# Patient Record
Sex: Male | Born: 1975 | Race: White | Hispanic: No | Smoking: Current every day smoker
Health system: Southern US, Community
[De-identification: ages and names within clinical notes are randomized; demographics above are authoritative.]

## PROBLEM LIST (undated history)

## (undated) DIAGNOSIS — F1910 Other psychoactive substance abuse, uncomplicated: Secondary | ICD-10-CM

## (undated) DIAGNOSIS — I10 Essential (primary) hypertension: Secondary | ICD-10-CM

## (undated) DIAGNOSIS — E119 Type 2 diabetes mellitus without complications: Secondary | ICD-10-CM

---

## 2012-09-15 ENCOUNTER — Emergency Department: Payer: Self-pay | Admitting: Emergency Medicine

## 2012-09-28 ENCOUNTER — Emergency Department: Payer: Self-pay | Admitting: Emergency Medicine

## 2013-09-27 ENCOUNTER — Emergency Department: Payer: Self-pay | Admitting: Emergency Medicine

## 2014-07-22 ENCOUNTER — Emergency Department
Admission: EM | Admit: 2014-07-22 | Discharge: 2014-07-22 | Disposition: A | Discharge status: Home / Self Care | Payer: Self-pay | Attending: Emergency Medicine | Admitting: Emergency Medicine

## 2014-07-22 ENCOUNTER — Encounter: Payer: Self-pay | Admitting: Emergency Medicine

## 2014-07-22 DIAGNOSIS — L02811 Cutaneous abscess of head [any part, except face]: Secondary | ICD-10-CM | POA: Insufficient documentation

## 2014-07-22 DIAGNOSIS — L0291 Cutaneous abscess, unspecified: Secondary | ICD-10-CM

## 2014-07-22 DIAGNOSIS — R61 Generalized hyperhidrosis: Secondary | ICD-10-CM | POA: Insufficient documentation

## 2014-07-22 MED ORDER — MUPIROCIN 2 % EX OINT
TOPICAL_OINTMENT | CUTANEOUS | Status: AC
Start: 2014-07-22 — End: 2015-07-22

## 2014-07-22 MED ORDER — TRAMADOL HCL 50 MG PO TABS: 50.0000 mg | ORAL_TABLET | Freq: Three times a day (TID) | ORAL | Status: DC | PRN

## 2014-07-22 MED ORDER — IBUPROFEN 800 MG PO TABS: 800.0000 mg | ORAL_TABLET | Freq: Three times a day (TID) | ORAL | Status: DC | PRN

## 2014-07-22 MED ORDER — SULFAMETHOXAZOLE-TRIMETHOPRIM 400-80 MG PO TABS: 1.0000 | ORAL_TABLET | Freq: Two times a day (BID) | ORAL | Status: DC

## 2014-07-22 NOTE — Discharge Instructions (Signed)
Keep area clean and dry. Clean daily with soap monitor. Apply topical anabiotic as prescribed. Take medications as prescribed.  Follow-up with primary care physician next week or the above as needed.  Return to the ER for new or worsening concerns.  Abscess An abscess is an infected area that contains a collection of pus and debris.It can occur in almost any part of the body. An abscess is also known as a furuncle or boil. CAUSES  An abscess occurs when tissue gets infected. This can occur from blockage of oil or sweat glands, infection of hair follicles, or a minor injury to the skin. As the body tries to fight the infection, pus collects in the area and creates pressure under the skin. This pressure causes pain. People with weakened immune systems have difficulty fighting infections and get certain abscesses more often.  SYMPTOMS Usually an abscess develops on the skin and becomes a painful mass that is red, warm, and tender. If the abscess forms under the skin, you may feel a moveable soft area under the skin. Some abscesses break open (rupture) on their own, but most will continue to get worse without care. The infection can spread deeper into the body and eventually into the bloodstream, causing you to feel ill.  DIAGNOSIS  Your caregiver will take your medical history and perform a physical exam. A sample of fluid may also be taken from the abscess to determine what is causing your infection. TREATMENT  Your caregiver may prescribe antibiotic medicines to fight the infection. However, taking antibiotics alone usually does not cure an abscess. Your caregiver may need to make a small cut (incision) in the abscess to drain the pus. In some cases, gauze is packed into the abscess to reduce pain and to continue draining the area. HOME CARE INSTRUCTIONS   Only take over-the-counter or prescription medicines for pain, discomfort, or fever as directed by your caregiver.  If you were prescribed  antibiotics, take them as directed. Finish them even if you start to feel better.  If gauze is used, follow your caregiver's directions for changing the gauze.  To avoid spreading the infection:  Keep your draining abscess covered with a bandage.  Wash your hands well.  Do not share personal care items, towels, or whirlpools with others.  Avoid skin contact with others.  Keep your skin and clothes clean around the abscess.  Keep all follow-up appointments as directed by your caregiver. SEEK MEDICAL CARE IF:   You have increased pain, swelling, redness, fluid drainage, or bleeding.  You have muscle aches, chills, or a general ill feeling.  You have a fever. MAKE SURE YOU:   Understand these instructions.  Will watch your condition.  Will get help right away if you are not doing well or get worse. Document Released: 12/05/2004 Document Revised: 08/27/2011 Document Reviewed: 05/10/2011 [facility] Patient Information 2015 [place] [place]. This information is not intended to replace advice given to you by your health care provider. Make sure you discuss any questions you have with your health care provider.

## 2014-07-22 NOTE — ED Provider Notes (Signed)
[facility] Emergency Department Provider Note  ____________________________________________  Time seen: Approximately 0940 AM  I have reviewed the triage vital signs and the nursing notes.   HISTORY  Chief Complaint Recurrent Skin Infections   HPI Marc Marks is a 39 y.o. male presents to the ER with complaints of area to the left side posterior head with drainage and tenderness. Patient states it originally started out as a small pimple however he then "picked" at it and then gradually increased with redness and drainage. Patient states spouse and mother have been actively draining pus from the area. Patient states it is no longer draining but still red and swollen. States pain is described as a tenderness at a 5 out of 10. Worsen palpated. Patient states he frequently gets pimples in this area and his hairline as he lays on that side and sweats at night. Patient denies fall or injury. Patient denies neck pain, headache, nausea, fever or other complaints.   History reviewed. No pertinent past medical history.  There are no active problems to display for this patient.   History reviewed. No pertinent past surgical history.  No current outpatient prescriptions on file.  Allergies Review of patient's allergies indicates no known allergies.  No family history on file.  Social History History  Substance Use Topics  . Smoking status: Never Smoker   . Smokeless tobacco: Not on file  . Alcohol Use: No    Review of Systems Constitutional: No fever/chills Eyes: No visual changes. ENT: No sore throat. Cardiovascular: Denies chest pain. Respiratory: Denies shortness of breath. Gastrointestinal: No abdominal pain.  No nausea, no vomiting.  No diarrhea.  No constipation. Genitourinary: Negative for dysuria. Musculoskeletal: Negative for back pain. Skin: positive as above Neurological: Negative for headaches, focal weakness or numbness.  10-point ROS  otherwise negative.  ____________________________________________   PHYSICAL EXAM:  VITAL SIGNS: ED Triage Vitals  Enc Vitals Group     BP 07/22/14 0841 147/94 mmHg     Pulse Rate 07/22/14 0841 96     Resp 07/22/14 0841 18     Temp 07/22/14 0841 97.7 F (36.5 C)     Temp Source 07/22/14 0841 Oral     SpO2 07/22/14 0841 99 %     Weight 07/22/14 0842 240 lb (108.863 kg)     Height 07/22/14 0842 5\' 11"  (1.803 m)     Head Cir --      Peak Flow --      Pain Score 07/22/14 0838 8     Pain Loc --      Pain Edu? --      Excl. in GC? --     Constitutional: Alert and oriented. Well appearing and in no acute distress. Eyes: Conjunctivae are normal. PERRL. EOMI. Head: Atraumatic. Nose: No congestion/rhinnorhea. Mouth/Throat: Mucous membranes are moist.  Oropharynx non-erythematous. Neck: No stridor.  No cervical spine tenderness to palpation. Hematological/Lymphatic/Immunilogical: No cervical lymphadenopathy. Cardiovascular: Normal rate, regular rhythm. Grossly normal heart sounds.  Good peripheral circulation. Respiratory: Normal respiratory effort.  No retractions. Lungs CTAB. Gastrointestinal: Soft and nontender. No distention. No abdominal bruits. No CVA tenderness. Musculoskeletal: No lower extremity tenderness nor edema.  No joint effusions. Neurologic:  Normal speech and language. No gross focal neurologic deficits are appreciated. Speech is normal. No gait instability. Skin:  Skin is warm, dry and intact. No rash noted. Left posterior head x 2 small <1 cm mildly erythematic and mildly indurated tender areas. No fluctuance, no pointing. No surrounding erythema.  No drainage.  Psychiatric: Mood and affect are normal. Speech and behavior are normal.  ____________________________________________  ____________________________________   PROCEDURES  Procedure(s) performed:  Left posterior head abscess drained by pt recently at home. Area cleaned with betadine. NO fluctuance. No  indication for I&D.  ____________________________________________   INITIAL IMPRESSION / ASSESSMENT AND PLAN / ED COURSE  Pertinent labs & imaging results that were available during my care of the patient were reviewed by me and considered in my medical decision making (see chart for details).  Well-appearing no acute distress. Patient presents to the ER for left posterior scalp abscess times approximately 1-2 weeks. Patient states that he and family have actively draining these areas. No current fluctuance or indication for I&D. Discussed wound care parameters keeping clean. We'll treat patient with by mouth Bactrim, when necessary tramadol, and topical mupirocin. Discussed follow-up and return parameters. ____________________________________________   FINAL CLINICAL IMPRESSION(S) / ED DIAGNOSES  Final diagnoses:  Abscess  left posterior scalp    Renford DillsLindsey Korena Nass, NP 07/22/14 1018  Minna AntisKevin Paduchowski, MD 07/24/14 1215

## 2014-07-22 NOTE — ED Notes (Signed)
Pt c/o red raised painful area to left side of head, states area also itches at times but mostly has burning sensation

## 2015-11-19 ENCOUNTER — Emergency Department
Admission: EM | Admit: 2015-11-19 | Discharge: 2015-11-20 | Disposition: A | Discharge status: Home / Self Care | Payer: Self-pay | Attending: Emergency Medicine | Admitting: Emergency Medicine

## 2015-11-19 ENCOUNTER — Encounter: Payer: Self-pay | Admitting: *Deleted

## 2015-11-19 DIAGNOSIS — L0291 Cutaneous abscess, unspecified: Secondary | ICD-10-CM

## 2015-11-19 DIAGNOSIS — L0231 Cutaneous abscess of buttock: Secondary | ICD-10-CM | POA: Insufficient documentation

## 2015-11-19 DIAGNOSIS — Z791 Long term (current) use of non-steroidal anti-inflammatories (NSAID): Secondary | ICD-10-CM | POA: Insufficient documentation

## 2015-11-19 MED ORDER — HYDROCODONE-ACETAMINOPHEN 5-325 MG PO TABS
1.0000 | ORAL_TABLET | Freq: Once | ORAL | Status: AC
  Administered 2015-11-19: 1 via ORAL
  Filled 2015-11-19: qty 1

## 2015-11-19 MED ORDER — HYDROCODONE-ACETAMINOPHEN 5-325 MG PO TABS: 1.0000 | ORAL_TABLET | ORAL | 0 refills | Status: DC | PRN

## 2015-11-19 MED ORDER — LIDOCAINE-EPINEPHRINE (PF) 1 %-1:200000 IJ SOLN
INTRAMUSCULAR | Status: AC
  Filled 2015-11-19: qty 30

## 2015-11-19 NOTE — ED Triage Notes (Signed)
Pt states he has an abscess on left buttock.  Sx for 1 week.  No drainage noted.

## 2015-11-19 NOTE — ED Provider Notes (Signed)
[facility] Provider Note   ____________________________________________    I have reviewed the triage vital signs and the nursing notes.   HISTORY  Chief Complaint Abscess     HPI Marc Marks is a 40 y.o. male who presents with complaints of an abscess on his buttocks. He reports that developed approximately one week ago and seemed to have gotten worse. He complains of significant pain while sitting which started yesterday. He reports his girlfriend attempted to pop it did not work. He denies fevers or chills. He has had abscesses in the past.   No past medical history on file.  There are no active problems to display for this patient.   No past surgical history on file.  Prior to Admission medications   Medication Sig Start Date End Date Taking? Authorizing Provider  HYDROcodone-acetaminophen (NORCO/VICODIN) 5-325 MG tablet Take 1 tablet by mouth every 4 (four) hours as needed for moderate pain. 11/19/15   Jene Everyobert Nuri Larmer, MD  ibuprofen (ADVIL,MOTRIN) 800 MG tablet Take 1 tablet (800 mg total) by mouth every 8 (eight) hours as needed for mild pain or moderate pain. 07/22/14   Renford DillsLindsey Miller, NP  sulfamethoxazole-trimethoprim (BACTRIM) 400-80 MG per tablet Take 1 tablet by mouth 2 (two) times daily. 07/22/14   Renford DillsLindsey Miller, NP  traMADol (ULTRAM) 50 MG tablet Take 1 tablet (50 mg total) by mouth every 8 (eight) hours as needed (Do not drive or operate machinery while taking as can cause drowsiness.). 07/22/14   Renford DillsLindsey Miller, NP     Allergies Tramadol  No family history on file.  Social History Social History  Substance Use Topics  . Smoking status: Never Smoker  . Smokeless tobacco: Never Used  . Alcohol use No    Review of Systems  Constitutional: No fever/chills     Gastrointestinal: No abdominal pain.  No nausea, no vomiting.    Skin: Skin abscess as  above    ____________________________________________   PHYSICAL EXAM:  VITAL SIGNS: ED Triage Vitals  Enc Vitals Group     BP 11/19/15 2220 (!) 150/93     Pulse Rate 11/19/15 2219 96     Resp 11/19/15 2219 20     Temp 11/19/15 2219 98.5 F (36.9 C)     Temp Source 11/19/15 2219 Oral     SpO2 11/19/15 2219 98 %     Weight 11/19/15 2220 220 lb (99.8 kg)     Height 11/19/15 2220 5\' 10"  (1.778 m)     Head Circumference --      Peak Flow --      Pain Score 11/19/15 2220 8     Pain Loc --      Pain Edu? --      Excl. in GC? --      Constitutional: Alert and oriented. No acute distress. Pleasant and interactive Eyes: Conjunctivae are normal.  Head: Atraumatic. Nose: No congestion/rhinnorhea. Mouth/Throat: Mucous membranes are moist.   Cardiovascular: Normal rate, regular rhythm.  Respiratory: Normal respiratory effort.  No retractions. Genitourinary: deferred Musculoskeletal: No lower extremity tenderness nor edema.   Neurologic:  Normal speech and language. No gross focal neurologic deficits are appreciated.   Skin:  Skin is warm, dry and intact. Patient with area of fluctuance and erythema actually 2 x 3 cm on the left buttock which has come to head. No surrounding erythema.   ____________________________________________   LABS (all labs ordered are listed, but only abnormal results are displayed)  Labs  Reviewed - No data to display ____________________________________________  EKG   ____________________________________________  RADIOLOGY   ____________________________________________   PROCEDURES  Procedure(s) performed: yes  INCISION AND DRAINAGE Performed by: Jene Every Consent: Verbal consent obtained. Risks and benefits: risks, benefits and alternatives were discussed Type: abscess  Body area: Left buttock  Anesthesia: local infiltration  Incision was made with a scalpel.  Local anesthetic: lidocaine 1% w epinephrine  Anesthetic  total: 2 ml  Complexity: complex Blunt dissection to break up loculations  Drainage: purulent  Drainage amount: moderate  Packing material: 1/4 in iodoform gauze  Patient tolerance: Patient tolerated the procedure well with no immediate complications.       Critical Care performed: No ____________________________________________   INITIAL IMPRESSION / ASSESSMENT AND PLAN / ED COURSE  Pertinent labs & imaging results that were available during my care of the patient were reviewed by me and considered in my medical decision making (see chart for details).  Successful I&D of abscess. No enema Indicated. Pain medication prescribed. Follow-up in 2 days as needed   ____________________________________________   FINAL CLINICAL IMPRESSION(S) / ED DIAGNOSES  Final diagnoses:  Abscess      NEW MEDICATIONS STARTED DURING THIS VISIT:  New Prescriptions   HYDROCODONE-ACETAMINOPHEN (NORCO/VICODIN) 5-325 MG TABLET    Take 1 tablet by mouth every 4 (four) hours as needed for moderate pain.     Note:  This document was prepared using Dragon voice recognition software and may include unintentional dictation errors.    Jene Every, MD 11/19/15 [phone]

## 2017-03-22 ENCOUNTER — Emergency Department: Payer: Self-pay

## 2017-03-22 ENCOUNTER — Emergency Department
Admission: EM | Admit: 2017-03-22 | Discharge: 2017-03-22 | Disposition: A | Discharge status: Home / Self Care | Payer: Self-pay | Attending: Emergency Medicine | Admitting: Emergency Medicine

## 2017-03-22 ENCOUNTER — Encounter: Payer: Self-pay | Admitting: Emergency Medicine

## 2017-03-22 ENCOUNTER — Other Ambulatory Visit: Payer: Self-pay

## 2017-03-22 DIAGNOSIS — F4320 Adjustment disorder, unspecified: Secondary | ICD-10-CM

## 2017-03-22 DIAGNOSIS — F1990 Other psychoactive substance use, unspecified, uncomplicated: Secondary | ICD-10-CM

## 2017-03-22 DIAGNOSIS — F19188 Other psychoactive substance abuse with other psychoactive substance-induced disorder: Secondary | ICD-10-CM | POA: Insufficient documentation

## 2017-03-22 DIAGNOSIS — Z79899 Other long term (current) drug therapy: Secondary | ICD-10-CM | POA: Insufficient documentation

## 2017-03-22 DIAGNOSIS — R51 Headache: Secondary | ICD-10-CM | POA: Insufficient documentation

## 2017-03-22 DIAGNOSIS — F1994 Other psychoactive substance use, unspecified with psychoactive substance-induced mood disorder: Secondary | ICD-10-CM

## 2017-03-22 HISTORY — DX: Essential (primary) hypertension: I10

## 2017-03-22 HISTORY — DX: Type 2 diabetes mellitus without complications: E11.9

## 2017-03-22 LAB — COMPREHENSIVE METABOLIC PANEL
ALT: 19 U/L (ref 17–63)
AST: 29 U/L (ref 15–41)
Albumin: 4.4 g/dL (ref 3.5–5.0)
Alkaline Phosphatase: 71 U/L (ref 38–126)
Anion gap: 13 (ref 5–15)
BILIRUBIN TOTAL: 1.1 mg/dL (ref 0.3–1.2)
BUN: 14 mg/dL (ref 6–20)
CO2: 24 mmol/L (ref 22–32)
Calcium: 8.9 mg/dL (ref 8.9–10.3)
Chloride: 105 mmol/L (ref 101–111)
Creatinine, Ser: 1.11 mg/dL (ref 0.61–1.24)
GFR calc non Af Amer: >60 mL/min (ref >60)
Glucose, Bld: 261 mg/dL — ABNORMAL HIGH (ref 65–99)
Potassium: 3.6 mmol/L (ref 3.5–5.1)
Sodium: 142 mmol/L (ref 135–145)
Total Protein: 8 g/dL (ref 6.5–8.1)

## 2017-03-22 LAB — URINE DRUG SCREEN, QUALITATIVE (ARMC ONLY)
AMPHETAMINES, UR SCREEN: NOT DETECTED
BENZODIAZEPINE, UR SCRN: NOT DETECTED
Barbiturates, Ur Screen: NOT DETECTED
Cannabinoid 50 Ng, Ur ~~LOC~~: NOT DETECTED
Cocaine Metabolite,Ur ~~LOC~~: NOT DETECTED
MDMA (Ecstasy)Ur Screen: NOT DETECTED
Methadone Scn, Ur: NOT DETECTED
Opiate, Ur Screen: POSITIVE — AB
PHENCYCLIDINE (PCP) UR S: NOT DETECTED
Tricyclic, Ur Screen: NOT DETECTED

## 2017-03-22 LAB — CBC WITH DIFFERENTIAL/PLATELET
Basophils Absolute: 0.1 10*3/uL (ref 0–0.1)
Basophils Relative: 1 %
Eosinophils Absolute: 0.1 10*3/uL (ref 0–0.7)
Eosinophils Relative: 1 %
HEMATOCRIT: 42.1 % (ref 40.0–52.0)
Hemoglobin: 14.1 g/dL (ref 13.0–18.0)
LYMPHS PCT: 10 %
Lymphs Abs: 1.3 10*3/uL (ref 1.0–3.6)
MCH: 28.5 pg (ref 26.0–34.0)
MCHC: 33.6 g/dL (ref 32.0–36.0)
MCV: 85 fL (ref 80.0–100.0)
MONOS PCT: 11 %
Monocytes Absolute: 1.4 10*3/uL — ABNORMAL HIGH (ref 0.2–1.0)
NEUTROS PCT: 77 %
Neutro Abs: 10.3 10*3/uL — ABNORMAL HIGH (ref 1.4–6.5)
Platelets: 246 10*3/uL (ref 150–440)
RBC: 4.96 MIL/uL (ref 4.40–5.90)
RDW: 14.7 % — ABNORMAL HIGH (ref 11.5–14.5)
WBC: 13.2 10*3/uL — AB (ref 3.8–10.6)

## 2017-03-22 LAB — ETHANOL: Alcohol, Ethyl (B): <10 mg/dL (ref <10)

## 2017-03-22 LAB — ACETAMINOPHEN LEVEL

## 2017-03-22 LAB — SALICYLATE LEVEL: Salicylate Lvl: <7 mg/dL (ref 2.8–30.0)

## 2017-03-22 MED ORDER — LORAZEPAM 2 MG/ML IJ SOLN
2.0000 mg | Freq: Once | INTRAMUSCULAR | Status: AC
  Administered 2017-03-22: 2 mg via INTRAMUSCULAR

## 2017-03-22 MED ORDER — DIPHENHYDRAMINE HCL 50 MG/ML IJ SOLN
25.0000 mg | Freq: Once | INTRAMUSCULAR | Status: AC
  Administered 2017-03-22: 25 mg via INTRAMUSCULAR

## 2017-03-22 MED ORDER — LORAZEPAM 2 MG/ML IJ SOLN
INTRAMUSCULAR | Status: AC
  Administered 2017-03-22: 2 mg via INTRAMUSCULAR
  Filled 2017-03-22: qty 1

## 2017-03-22 MED ORDER — HALOPERIDOL LACTATE 5 MG/ML IJ SOLN
5.0000 mg | Freq: Once | INTRAMUSCULAR | Status: AC
  Administered 2017-03-22: 5 mg via INTRAMUSCULAR

## 2017-03-22 MED ORDER — DIPHENHYDRAMINE HCL 50 MG/ML IJ SOLN
INTRAMUSCULAR | Status: AC
  Administered 2017-03-22: 25 mg via INTRAMUSCULAR
  Filled 2017-03-22: qty 1

## 2017-03-22 MED ORDER — SODIUM CHLORIDE 0.9 % IV BOLUS (SEPSIS)
1000.0000 mL | Freq: Once | INTRAVENOUS | Status: AC
  Administered 2017-03-22: 1000 mL via INTRAVENOUS

## 2017-03-22 MED ORDER — HALOPERIDOL LACTATE 5 MG/ML IJ SOLN
INTRAMUSCULAR | Status: AC
  Administered 2017-03-22: 5 mg via INTRAMUSCULAR
  Filled 2017-03-22: qty 1

## 2017-03-22 NOTE — ED Notes (Signed)
Patient cont drowsy. Not taking po fluids. IV and NS started.

## 2017-03-22 NOTE — ED Provider Notes (Signed)
-----------------------------------------   3:17 PM on 03/22/2017 ----------------------------------------- Psychiatry consult note reviewed, finds the patient to be psychiatrically stable and not a danger to himself or others. Diagnosis of adjustment disorder. UDS is positive for opiate, and by history the patient has some substantial drug use necessitating Narcan on scene. Consistent with opiate use disorder as well. Patient's been given resources for outpatient substance abuse management. Remains calm comfortable with stable vital signs here in the ED currently. Medically stable. He does not have a guardian. IVC reversed by the psychiatrist.  Final diagnoses:  Substance induced mood disorder (HCC)  Adjustment disorder, unspecified type  Substance use disorder        Sharman CheekStafford, Ashlon Lottman, MD 03/22/17 [phone]

## 2017-03-22 NOTE — BH Assessment (Signed)
Assessment Note  Marc Marks is an 42 y.o. male admitted through IVC. Pt denies any SI, HI, AH, or VH at time of assessment. Pt reports that he was admitted following dispute with mother at home where he became irate and admitted to "hitting stuff". When asked why the police were called pt reports, "I reckon because of my behaviors." Pt reports that after the argument with mom that he went to sleep and woke up with 3 or 4 cops in his room who "bullied him into coming up here." Pt is suspected of being a substance user, but denies any use besides chewing tobacco. Pt has previously denied urine sample.  Diagnosis: Polysubstance use  Past Medical History:  Past Medical History:  Diagnosis Date  . Diabetes mellitus without complication (HCC)   . Hypertension     History reviewed. No pertinent surgical history.  Family History: History reviewed. No pertinent family history.  Social History:  reports that  has never smoked. His smokeless tobacco use includes snuff. He reports that he uses drugs. He reports that he does not drink alcohol.  Additional Social History:  Alcohol / Drug Use Pain Medications: see mar Prescriptions: see mar Over the Counter: see mar History of alcohol / drug use?: Yes Longest period of sobriety (when/how long): Unknown, pt denies alcohol use Negative Consequences of Use: Financial, Personal relationships Withdrawal Symptoms: Agitation, Sweats Substance #1 Name of Substance 1: Chewing Tobacco 1 - Age of First Use: unknown 1 - Amount (size/oz): pt did not specify amount 1 - Frequency: daily 1 - Duration: unknown 1 - Last Use / Amount: 03/21/2017  CIWA: CIWA-Ar BP: 118/85 Pulse Rate: 70 COWS:    Allergies:  Allergies  Allergen Reactions  . Tramadol Other (See Comments)    dizziness    Home Medications:  (Not in a hospital admission)  OB/GYN Status:  No LMP for male patient.  General Assessment Data Assessment unable to be completed:  (Completed) Location of Assessment: [facility] ED TTS Assessment: In system Is this a Tele or Face-to-Face Assessment?: Face-to-Face Is this an Initial Assessment or a Re-assessment for this encounter?: Initial Assessment Marital status: Divorced [place] name: N/A Is patient pregnant?: No Pregnancy Status: No Living Arrangements: Parent(Pt reports to living with mother) Can pt return to current living arrangement?: Yes Admission Status: Involuntary Is patient capable of signing voluntary admission?: No Referral Source: Self/Family/Friend Insurance type: None reported  Medical Screening Exam [facility] Walk-in ONLY) Medical Exam completed: Yes  Crisis Care Plan Living Arrangements: Parent(Pt reports to living with mother) Legal Guardian: Other:(N/A) Name of Psychiatrist: None indicated Name of Therapist: None indicated  Education Status Is patient currently in school?: No Current Grade: N/A Highest grade of school patient has completed: 12th Name of school: Unknown Contact person: Unknown  Risk to self with the past 6 months Suicidal Ideation: No-Not Currently/Within Last 6 Months Has patient been a risk to self within the past 6 months prior to admission? : Other (comment)(Pt denies) Suicidal Intent: No-Not Currently/Within Last 6 Months Has patient had any suicidal intent within the past 6 months prior to admission? : (Pt denies) Is patient at risk for suicide?: No, but patient needs Medical Clearance Suicidal Plan?: No Has patient had any suicidal plan within the past 6 months prior to admission? : Other (comment)(Pt denies) Access to Means: No What has been your use of drugs/alcohol within the last 12 months?: Pt denies any use besides tobacco Previous Attempts/Gestures: No How many times?: (N/A) Other Self Harm  Risks: None indicated Triggers for Past Attempts: None known Intentional Self Injurious Behavior: None Family Suicide History: No Recent stressful life event(s):  Conflict (Comment)(Pt reports to being triggered by argument with mother) Persecutory voices/beliefs?: No Depression: Yes Depression Symptoms: Feeling angry/irritable, Feeling worthless/self pity Substance abuse history and/or treatment for substance abuse?: (Unknown) Suicide prevention information given to non-admitted patients: Not applicable  Risk to Others within the past 6 months Homicidal Ideation: No Does patient have any lifetime risk of violence toward others beyond the six months prior to admission? : Unknown Thoughts of Harm to Others: No Current Homicidal Intent: No Current Homicidal Plan: No Access to Homicidal Means: No Identified Victim: (N/A) History of harm to others?: No Assessment of Violence: On admission Violent Behavior Description: Pt. admits to "hitting stuff" prior to IVC Does patient have access to weapons?: No Criminal Charges Pending?: Yes Describe Pending Criminal Charges: Traffic/ Infraction Does patient have a court date: Yes Court Date: 05/15/17 Is patient on probation?: Unknown  Psychosis Hallucinations: None noted Delusions: None noted  Mental Status Report Appearance/Hygiene: Disheveled Eye Contact: Poor Motor Activity: Restlessness, Shuffling, Agitation Speech: Incoherent, Soft, Slurred Level of Consciousness: Drowsy, Restless Mood: Apathetic, Irritable Affect: Irritable, Depressed, Blunted Anxiety Level: Minimal Thought Processes: Circumstantial Judgement: Partial Orientation: Person, Place, Time, Situation, Appropriate for developmental age Obsessive Compulsive Thoughts/Behaviors: None  Cognitive Functioning Concentration: Poor Memory: Recent Intact IQ: Average Insight: Fair Impulse Control: Fair Appetite: Good Weight Loss: 0 Weight Gain: 0 Sleep: Increased Total Hours of Sleep: 8 Vegetative Symptoms: None  ADLScreening [facility] Assessment Services) Patient's cognitive ability adequate to safely complete daily activities?:  Yes Patient able to express need for assistance with ADLs?: Yes Independently performs ADLs?: Yes (appropriate for developmental age)  Prior Inpatient Therapy Prior Inpatient Therapy: No Prior Therapy Dates: N/A Prior Therapy Facilty/Provider(s): N/A Reason for Treatment: N/A  Prior Outpatient Therapy Prior Outpatient Therapy: No Prior Therapy Dates: N/A Prior Therapy Facilty/Provider(s): (N/A) Reason for Treatment: (N/A) Does patient have an ACCT team?: No Does patient have Intensive In-House Services?  : No Does patient have P4CC services?: No  ADL Screening (condition at time of admission) Patient's cognitive ability adequate to safely complete daily activities?: Yes Is the patient deaf or have difficulty hearing?: No Does the patient have difficulty seeing, even when wearing glasses/contacts?: No Does the patient have difficulty concentrating, remembering, or making decisions?: No Patient able to express need for assistance with ADLs?: Yes Does the patient have difficulty dressing or bathing?: No Independently performs ADLs?: Yes (appropriate for developmental age) Does the patient have difficulty walking or climbing stairs?: No Weakness of Legs: None Weakness of Arms/Hands: None  Home Assistive Devices/Equipment Home Assistive Devices/Equipment: None  Therapy Consults (therapy consults require a physician order) PT Evaluation Needed: No OT Evalulation Needed: No SLP Evaluation Needed: No Abuse/Neglect Assessment (Assessment to be complete while patient is alone) Abuse/Neglect Assessment Can Be Completed: Yes Physical Abuse: Denies Verbal Abuse: Denies Sexual Abuse: Denies Exploitation of patient/patient's resources: Denies Self-Neglect: Denies Values / Beliefs Cultural Requests During Hospitalization: None Spiritual Requests During Hospitalization: None Consults Spiritual Care Consult Needed: No Social Work Consult Needed: No Merchant navy officer (For  Healthcare) Does Patient Have a Medical Advance Directive?: No    Additional Information CIRT Risk: No Does patient have medical clearance?: No(Waiting for urine sample)     Disposition:  Disposition Initial Assessment Completed for this Encounter: Yes Disposition of Patient: Pending Review with psychiatrist  On Site Evaluation by:   Reviewed with Physician:  Aubery LappingJerrica  Elliannah Wayment, MS, LPCA 03/22/2017 12:18 PM

## 2017-03-22 NOTE — ED Notes (Signed)
Mother, Marc Marks, [phone], reports pt was acting erratic and that is why she called 911, reports pt has bad dreams, is suspicious that pt may die in the same manner that pt's fiancee died on Nov 26 (unintentional overdose suspected), mother unable to verbalize specific suspicions for further illegal drug use, wants pt to to "straighten up his life"  Pt reports drinking at friends house (who also lost a spouse) beers and the ingestion of 5mg  percocet, pt resents mother's suspicions, pt denies SI/HI/hallucinations, requested to go home - pt calm and but perturbed at being held against will  EMS reports unable to determine rational sequence of events: sheriff dept reports pt "bouncing off the walls", medics on scene report pt so obtunded they gave 2 x internasal Narcan
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
## 2017-03-22 NOTE — ED Notes (Signed)
Pt given a cup of water and a pillow. Pt lying in bed asleep calm. Nothing needed from staff at this time

## 2017-03-22 NOTE — ED Notes (Signed)
Awake. Wants to go home. Requested provide urine spec to complete his workup. Secretary [facility]

## 2017-03-22 NOTE — ED Triage Notes (Signed)
Patient presents to Emergency Department via EMS with complaints of HA

## 2017-03-22 NOTE — ED Notes (Signed)
Patient awakened briefly. Alert and knows is in hospital when questioned. Denies discomfort except fatigue. Noted jumpy and wide eyed when spoken to. Returns to sleep when undisturbed.

## 2017-03-22 NOTE — ED Notes (Signed)
BEHAVIORAL HEALTH ROUNDING Patient sleeping: Yes Patient alert and oriented: not applicable Behavior appropriate: Yes.  ; If no, describe:  Nutrition and fluids offered: Yes  Toileting and hygiene offered: No Sitter present: not applicable Law enforcement present: Yes  

## 2017-03-22 NOTE — ED Notes (Signed)
BEHAVIORAL HEALTH ROUNDING Patient sleeping: Yes.   Patient alert and oriented: yes Behavior appropriate: Yes.  ; If no, describe:  Nutrition and fluids offered: Yes  Toileting and hygiene offered: Yes  Sitter present: not applicable Law enforcement present: Yes  

## 2017-03-22 NOTE — ED Notes (Signed)
[facility] [phone]

## 2017-03-22 NOTE — ED Notes (Signed)
Patient alert and oriented. Gait steady. IVC released. Pt discharged with instructions. To lobby to meet mom for transport home.

## 2017-03-22 NOTE — ED Notes (Signed)

## 2017-12-22 ENCOUNTER — Emergency Department: Payer: No Typology Code available for payment source

## 2017-12-22 ENCOUNTER — Other Ambulatory Visit: Payer: Self-pay

## 2017-12-22 ENCOUNTER — Emergency Department
Admission: EM | Admit: 2017-12-22 | Discharge: 2017-12-23 | Disposition: A | Discharge status: Home / Self Care | Payer: No Typology Code available for payment source | Attending: Emergency Medicine | Admitting: Emergency Medicine

## 2017-12-22 DIAGNOSIS — E119 Type 2 diabetes mellitus without complications: Secondary | ICD-10-CM | POA: Insufficient documentation

## 2017-12-22 DIAGNOSIS — M79671 Pain in right foot: Secondary | ICD-10-CM | POA: Diagnosis not present

## 2017-12-22 DIAGNOSIS — I10 Essential (primary) hypertension: Secondary | ICD-10-CM | POA: Diagnosis not present

## 2017-12-22 DIAGNOSIS — Z79899 Other long term (current) drug therapy: Secondary | ICD-10-CM | POA: Diagnosis not present

## 2017-12-22 LAB — CBC WITH DIFFERENTIAL/PLATELET
Abs Immature Granulocytes: 0.07 10*3/uL (ref 0.00–0.07)
BASOS ABS: 0.1 10*3/uL (ref 0.0–0.1)
BASOS PCT: 1 %
Eosinophils Absolute: 0.2 10*3/uL (ref 0.0–0.5)
Eosinophils Relative: 1 %
HCT: 42.5 % (ref 39.0–52.0)
Hemoglobin: 14.6 g/dL (ref 13.0–17.0)
IMMATURE GRANULOCYTES: 1 %
Lymphocytes Relative: 13 %
Lymphs Abs: 1.9 10*3/uL (ref 0.7–4.0)
MCH: 29.8 pg (ref 26.0–34.0)
MCHC: 34.4 g/dL (ref 30.0–36.0)
MCV: 86.7 fL (ref 80.0–100.0)
Monocytes Absolute: 1.3 10*3/uL — ABNORMAL HIGH (ref 0.1–1.0)
Monocytes Relative: 9 %
NEUTROS ABS: 10.9 10*3/uL — AB (ref 1.7–7.7)
NEUTROS PCT: 75 %
PLATELETS: 286 10*3/uL (ref 150–400)
RBC: 4.9 MIL/uL (ref 4.22–5.81)
RDW: 12.8 % (ref 11.5–15.5)
WBC: 14.4 10*3/uL — AB (ref 4.0–10.5)
nRBC: 0 % (ref 0.0–0.2)

## 2017-12-22 LAB — COMPREHENSIVE METABOLIC PANEL
ALT: 17 U/L (ref 0–44)
AST: 19 U/L (ref 15–41)
Albumin: 4.2 g/dL (ref 3.5–5.0)
Alkaline Phosphatase: 58 U/L (ref 38–126)
Anion gap: 10 (ref 5–15)
BUN: 18 mg/dL (ref 6–20)
CHLORIDE: 103 mmol/L (ref 98–111)
CO2: 33 mmol/L — AB (ref 22–32)
CREATININE: 1.47 mg/dL — AB (ref 0.61–1.24)
Calcium: 9.7 mg/dL (ref 8.9–10.3)
GFR calc non Af Amer: 58 mL/min — ABNORMAL LOW (ref >60)
Glucose, Bld: 265 mg/dL — ABNORMAL HIGH (ref 70–99)
Potassium: 3.3 mmol/L — ABNORMAL LOW (ref 3.5–5.1)
Sodium: 146 mmol/L — ABNORMAL HIGH (ref 135–145)
Total Bilirubin: 1 mg/dL (ref 0.3–1.2)
Total Protein: 7.4 g/dL (ref 6.5–8.1)

## 2017-12-22 LAB — ETHANOL

## 2017-12-22 MED ORDER — LORAZEPAM 2 MG/ML IJ SOLN
1.0000 mg | Freq: Once | INTRAMUSCULAR | Status: AC
  Administered 2017-12-22: 1 mg via INTRAVENOUS
  Filled 2017-12-22: qty 1
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
## 2017-12-23 MED ORDER — AMMONIA AROMATIC IN INHA
0.6000 mL | Freq: Once | RESPIRATORY_TRACT | Status: AC
  Administered 2017-12-23: 0.6 mL via RESPIRATORY_TRACT

## 2017-12-23 MED ORDER — AMMONIA AROMATIC IN INHA
RESPIRATORY_TRACT | Status: AC
  Administered 2017-12-23: 0.6 mL via RESPIRATORY_TRACT
  Filled 2017-12-23: qty 20

## 2017-12-23 NOTE — ED Notes (Signed)
Unable to get BP due to pt fidgeting and wanting to be discharged.

## 2017-12-23 NOTE — ED Provider Notes (Signed)
The patient is now more awake and arousable.  His right can take him home.   Merrily Brittle, MD 12/23/17 [phone]

## 2017-12-23 NOTE — ED Notes (Signed)
Pt woken up by [place]. MD stated that pt was cleared to be discharged. Pt and family understood discharge instructions and okay with being discharged.

## 2019-04-17 ENCOUNTER — Emergency Department
Admission: EM | Admit: 2019-04-17 | Discharge: 2019-04-17 | Disposition: A | Discharge status: Home / Self Care | Payer: Self-pay | Attending: Student | Admitting: Student

## 2019-04-17 ENCOUNTER — Other Ambulatory Visit: Payer: Self-pay

## 2019-04-17 DIAGNOSIS — T50901A Poisoning by unspecified drugs, medicaments and biological substances, accidental (unintentional), initial encounter: Secondary | ICD-10-CM | POA: Insufficient documentation

## 2019-04-17 DIAGNOSIS — I10 Essential (primary) hypertension: Secondary | ICD-10-CM | POA: Insufficient documentation

## 2019-04-17 DIAGNOSIS — F17220 Nicotine dependence, chewing tobacco, uncomplicated: Secondary | ICD-10-CM | POA: Insufficient documentation

## 2019-04-17 DIAGNOSIS — F1990 Other psychoactive substance use, unspecified, uncomplicated: Secondary | ICD-10-CM | POA: Insufficient documentation

## 2019-04-17 DIAGNOSIS — E119 Type 2 diabetes mellitus without complications: Secondary | ICD-10-CM | POA: Insufficient documentation

## 2019-04-17 LAB — COMPREHENSIVE METABOLIC PANEL
ALT: 13 U/L (ref 0–44)
AST: 22 U/L (ref 15–41)
Albumin: 4 g/dL (ref 3.5–5.0)
Alkaline Phosphatase: 61 U/L (ref 38–126)
Anion gap: 15 (ref 5–15)
BUN: 13 mg/dL (ref 6–20)
CO2: 21 mmol/L — ABNORMAL LOW (ref 22–32)
Calcium: 9.4 mg/dL (ref 8.9–10.3)
Chloride: 103 mmol/L (ref 98–111)
Creatinine, Ser: 1.38 mg/dL — ABNORMAL HIGH (ref 0.61–1.24)
GFR calc Af Amer: >60 mL/min (ref >60)
GFR calc non Af Amer: >60 mL/min (ref >60)
Glucose, Bld: 91 mg/dL (ref 70–99)
Potassium: 3.9 mmol/L (ref 3.5–5.1)
Sodium: 139 mmol/L (ref 135–145)
Total Bilirubin: 1.5 mg/dL — ABNORMAL HIGH (ref 0.3–1.2)
Total Protein: 7.5 g/dL (ref 6.5–8.1)

## 2019-04-17 LAB — CBC
HCT: 42.4 % (ref 39.0–52.0)
Hemoglobin: 14.3 g/dL (ref 13.0–17.0)
MCH: 29.2 pg (ref 26.0–34.0)
MCHC: 33.7 g/dL (ref 30.0–36.0)
MCV: 86.7 fL (ref 80.0–100.0)
Platelets: 305 10*3/uL (ref 150–400)
RBC: 4.89 MIL/uL (ref 4.22–5.81)
RDW: 13.4 % (ref 11.5–15.5)
WBC: 19.1 10*3/uL — ABNORMAL HIGH (ref 4.0–10.5)
nRBC: 0 % (ref 0.0–0.2)

## 2019-04-17 LAB — URINE DRUG SCREEN, QUALITATIVE (ARMC ONLY)
Amphetamines, Ur Screen: POSITIVE — AB
Barbiturates, Ur Screen: NOT DETECTED
Benzodiazepine, Ur Scrn: NOT DETECTED
Cannabinoid 50 Ng, Ur ~~LOC~~: NOT DETECTED
Cocaine Metabolite,Ur ~~LOC~~: POSITIVE — AB
MDMA (Ecstasy)Ur Screen: NOT DETECTED
Methadone Scn, Ur: NOT DETECTED
Opiate, Ur Screen: POSITIVE — AB
Phencyclidine (PCP) Ur S: NOT DETECTED
Tricyclic, Ur Screen: NOT DETECTED

## 2019-04-17 LAB — SALICYLATE LEVEL: Salicylate Lvl: <7 mg/dL — ABNORMAL LOW (ref 7.0–30.0)

## 2019-04-17 LAB — ACETAMINOPHEN LEVEL: Acetaminophen (Tylenol), Serum: <10 ug/mL — ABNORMAL LOW (ref 10–30)

## 2019-04-17 LAB — ETHANOL: Alcohol, Ethyl (B): <10 mg/dL (ref <10)

## 2019-04-17 MED ORDER — NALOXONE HCL 4 MG/0.1ML NA LIQD: NASAL | 0 refills | Status: DC

## 2019-04-17 MED ORDER — DOXYCYCLINE HYCLATE 100 MG PO CAPS: 100.0000 mg | ORAL_CAPSULE | Freq: Two times a day (BID) | ORAL | 0 refills | Status: AC

## 2019-04-17 MED ORDER — NALOXONE HCL 4 MG/0.1ML NA LIQD
1.0000 | Freq: Once | NASAL | Status: AC
  Administered 2019-04-17: 23:00:00 1 via NASAL
  Filled 2019-04-17: qty 4

## 2019-04-17 MED ORDER — DOXYCYCLINE HYCLATE 100 MG PO TABS
100.0000 mg | ORAL_TABLET | Freq: Once | ORAL | Status: AC
  Administered 2019-04-17: 100 mg via ORAL
  Filled 2019-04-17: qty 1
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
## 2019-04-17 NOTE — ED Provider Notes (Signed)
[facility] Emergency Department Provider Note  ____________________________________________   First MD Initiated Contact with Patient 04/17/19 1759     (approximate)  I have reviewed the triage vital signs and the nursing notes.  History  Chief Complaint Drug Overdose    HPI Marc Marks is a 44 y.o. male history of HTN, DM, opiate abuse who presents to the emergency department for an accidental overdose.  Patient states he took 2 pills that he was told/thought were Percocets to help with chronic pain.  Shortly thereafter he became unconscious with snoring respirations.  Given 2 mg intranasal Narcan with EMS with good response.  Arrives to the emergency department alert and oriented, no respiratory distress.  Patient denies ingestion with intent of self-harm.  No SI.  States he was simply trying to help with his chronic pain.  Patient denies any other coingestions, including any alcohol, or other over-the-counter medications.  He denies any recent opiate use, but does admit to a past history of snorting heroin.   Past Medical Hx Past Medical History:  Diagnosis Date  . Diabetes mellitus without complication ([place])   . Hypertension     Problem List There are no problems to display for this patient.   Past Surgical Hx History reviewed. No pertinent surgical history.  Medications Prior to Admission medications   Medication Sig Start Date End Date Taking? Authorizing Provider  HYDROcodone-acetaminophen (NORCO/VICODIN) 5-325 MG tablet Take 1 tablet by mouth every 4 (four) hours as needed for moderate pain. 11/19/15   Lavonia Drafts, MD  ibuprofen (ADVIL,MOTRIN) 800 MG tablet Take 1 tablet (800 mg total) by mouth every 8 (eight) hours as needed for mild pain or moderate pain. 07/22/14   Marylene Land, NP  sulfamethoxazole-trimethoprim (BACTRIM) 400-80 MG per tablet Take 1 tablet by mouth 2 (two) times daily. 07/22/14   Marylene Land, NP  traMADol (ULTRAM)  50 MG tablet Take 1 tablet (50 mg total) by mouth every 8 (eight) hours as needed (Do not drive or operate machinery while taking as can cause drowsiness.). 07/22/14   Marylene Land, NP    Allergies Tramadol  Family Hx History reviewed. No pertinent family history.  Social Hx Social History   Tobacco Use  . Smoking status: Never Smoker  . Smokeless tobacco: Current User    Types: Snuff  Substance Use Topics  . Alcohol use: No  . Drug use: Yes    Comment: percoset     Review of Systems  Constitutional: Negative for fever, chills.  Positive for accidental overdose. Eyes: Negative for visual changes. ENT: Negative for sore throat. Cardiovascular: Negative for chest pain. Respiratory: Negative for shortness of breath. Gastrointestinal: Negative for nausea, vomiting.  Genitourinary: Negative for dysuria. Musculoskeletal: Negative for leg swelling. Skin: Positive for scattered skin picking/excoriations. Neurological: Negative for headaches.   Physical Exam  Vital Signs: ED Triage Vitals  Enc Vitals Group     BP 04/17/19 1801 (!) 148/102     Pulse Rate 04/17/19 1801 92     Resp 04/17/19 1801 18     Temp 04/17/19 1801 98 F (36.7 C)     Temp Source 04/17/19 1801 Oral     SpO2 04/17/19 1801 98 %     Weight 04/17/19 1756 198 lb 6.6 oz (90 kg)     Height 04/17/19 1756 5' 11"  (1.803 m)     Head Circumference --      Peak Flow --      Pain Score 04/17/19 1756 0  Pain Loc --      Pain Edu? --      Excl. in [place]? --     Constitutional: Alert and oriented.  Head: Normocephalic. Atraumatic. Eyes: Conjunctivae clear. Sclera anicteric. Nose: No congestion. No rhinorrhea. Mouth/Throat: Wearing mask.  Neck: No stridor.   Cardiovascular: Normal rate, regular rhythm. Extremities well perfused. Respiratory: Normal respiratory effort.  Musculoskeletal: No lower extremity edema. No deformities. Neurologic:  Normal speech and language. No gross focal neurologic deficits are  appreciated.  Skin: Several small, scattered less than 1 cm lesions of skin picking, erythema and irritation.  No fluctuance or associated abscesses. Psychiatric: Mood and affect are appropriate for situation.  Denies SI or ingestion with the intent of self-harm.  EKG  Personally reviewed.   Rate: 99 Rhythm: sinus Axis: normal Intervals: WNL No acute ischemic changes No STEMI   Procedures  Procedure(s) performed (including critical care):  Procedures   Initial Impression / Assessment and Plan / ED Course  44 y.o. male who presents to the ED for accidental overdose, requiring Narcan with EMS.  Arrives to the emergency department alert and oriented.  Denies SI or ingestion with the intent of self-harm.    We will plan to observe for period of several hours to ensure no recurrence of apnea or opiate side effects.  Basic screening labs notable for leukocytosis, likely stress reaction in the setting of his overdose.  Aside from some areas of small skin picking and perhaps mild associated cellulitis, there is no subjective or localizing evidence on exam of severe infection, sepsis, or systemic toxicity. UDS + multi substance use. Remainder of labs w/o actionable derangements.   Patient has remained stable after approximately 4 hours of observation s/p Narcan with no evidence of recurrent respiratory compromise or apnea.  As such, he is stable for discharge.  Provided [facility] resources as well as a home Narcan kit.  Also provided Rx for doxycycline for mild cellulitis as noted above. Given return precautions.   Final Clinical Impression(s) / ED Diagnosis  Final diagnoses:  Accidental overdose, initial encounter  Substance use       Note:  This document was prepared using Dragon voice recognition software and may include unintentional dictation errors.   Lilia Pro., MD 04/17/19 2141

## 2019-04-17 NOTE — ED Notes (Signed)
Pt. Given discharge instructions, scripts and one 4 mg single dose of Narcan spray w/ instructions on use to take home.

## 2019-04-17 NOTE — ED Notes (Signed)
Pt given meal tray and sprite. 

## 2019-04-17 NOTE — ED Notes (Signed)
Pt. Given phone to call for ride. 

## 2019-04-17 NOTE — ED Notes (Signed)
Pt. Resting on 19 H bed.  Pt. Stated he was hungry.  Pt. Given meal tray and drink. Pt. Calm and cooperative at this time.

## 2019-04-17 NOTE — ED Notes (Signed)
Pt. Discharged with instructions of narcan usage.

## 2019-04-17 NOTE — Discharge Instructions (Signed)
You have been seen in the Emergency Department (ED) today for an accidental opiate overdose.  If you continue using opiates and/or other illegal substances, you can seriously hurt yourself, including stopping breathing and dying.  If you have any outpatient physician or therapist, please follow up with them, as they may help provide additional resources.  You were also provided with a home Narcan kit.  Keep it with you and let your friends and family know you have one. If you overdose again, they can administer the medication in your nose. However, but you MUST still call 911 and come to the Emergency Department because the medication will wear off and you could stop breathing again.  Please return to the ED immediately if you have ANY thoughts of hurting yourself or anyone else, so that we may help you.  Follow up with your doctor and/or therapist as soon as possible regarding today's ED visit.     Please contact [facility] for additional mental health/psychiatric assistance:  [address], [place] [address] Phone:  [phone] or [phone]  Open Access:   Walk-in ASSESSMENT hours, M-W-F, 8:00am - 3:00pm Advanced Acess CRISIS:  M-F, 8:00am - 8:00pm Outpatient Services Office Hours:  M-F, 8:00am - 5:00pm

## 2019-04-19 ENCOUNTER — Encounter: Payer: Self-pay | Admitting: Emergency Medicine

## 2019-04-19 ENCOUNTER — Emergency Department
Admission: EM | Admit: 2019-04-19 | Discharge: 2019-04-19 | Disposition: A | Discharge status: Home / Self Care | Payer: Self-pay | Attending: Emergency Medicine | Admitting: Emergency Medicine

## 2019-04-19 ENCOUNTER — Other Ambulatory Visit: Payer: Self-pay

## 2019-04-19 DIAGNOSIS — R748 Abnormal levels of other serum enzymes: Secondary | ICD-10-CM | POA: Insufficient documentation

## 2019-04-19 DIAGNOSIS — R778 Other specified abnormalities of plasma proteins: Secondary | ICD-10-CM | POA: Insufficient documentation

## 2019-04-19 DIAGNOSIS — E119 Type 2 diabetes mellitus without complications: Secondary | ICD-10-CM | POA: Insufficient documentation

## 2019-04-19 DIAGNOSIS — I10 Essential (primary) hypertension: Secondary | ICD-10-CM | POA: Insufficient documentation

## 2019-04-19 DIAGNOSIS — T43604A Poisoning by unspecified psychostimulants, undetermined, initial encounter: Secondary | ICD-10-CM | POA: Insufficient documentation

## 2019-04-19 DIAGNOSIS — Z20822 Contact with and (suspected) exposure to covid-19: Secondary | ICD-10-CM | POA: Insufficient documentation

## 2019-04-19 HISTORY — DX: Other psychoactive substance abuse, uncomplicated: F19.10

## 2019-04-19 LAB — URINE DRUG SCREEN, QUALITATIVE (ARMC ONLY)
Amphetamines, Ur Screen: POSITIVE — AB
Barbiturates, Ur Screen: NOT DETECTED
Benzodiazepine, Ur Scrn: NOT DETECTED
Cannabinoid 50 Ng, Ur ~~LOC~~: NOT DETECTED
Cocaine Metabolite,Ur ~~LOC~~: POSITIVE — AB
MDMA (Ecstasy)Ur Screen: NOT DETECTED
Methadone Scn, Ur: NOT DETECTED
Opiate, Ur Screen: POSITIVE — AB
Phencyclidine (PCP) Ur S: NOT DETECTED
Tricyclic, Ur Screen: NOT DETECTED

## 2019-04-19 LAB — CBC WITH DIFFERENTIAL/PLATELET
Abs Immature Granulocytes: 0.01 10*3/uL (ref 0.00–0.07)
Basophils Absolute: 0.1 10*3/uL (ref 0.0–0.1)
Basophils Relative: 1 %
Eosinophils Absolute: 0.2 10*3/uL (ref 0.0–0.5)
Eosinophils Relative: 2 %
HCT: 41.2 % (ref 39.0–52.0)
Hemoglobin: 14 g/dL (ref 13.0–17.0)
Immature Granulocytes: 0 %
Lymphocytes Relative: 27 %
Lymphs Abs: 2.8 10*3/uL (ref 0.7–4.0)
MCH: 29.5 pg (ref 26.0–34.0)
MCHC: 34 g/dL (ref 30.0–36.0)
MCV: 86.9 fL (ref 80.0–100.0)
Monocytes Absolute: 1.2 10*3/uL — ABNORMAL HIGH (ref 0.1–1.0)
Monocytes Relative: 12 %
Neutro Abs: 6.2 10*3/uL (ref 1.7–7.7)
Neutrophils Relative %: 58 %
Platelets: 309 10*3/uL (ref 150–400)
RBC: 4.74 MIL/uL (ref 4.22–5.81)
RDW: 13.4 % (ref 11.5–15.5)
WBC: 10.4 10*3/uL (ref 4.0–10.5)
nRBC: 0 % (ref 0.0–0.2)

## 2019-04-19 LAB — URINALYSIS, ROUTINE W REFLEX MICROSCOPIC
Bacteria, UA: NONE SEEN
Bilirubin Urine: NEGATIVE
Glucose, UA: NEGATIVE mg/dL
Hgb urine dipstick: NEGATIVE
Ketones, ur: NEGATIVE mg/dL
Leukocytes,Ua: NEGATIVE
Nitrite: NEGATIVE
Protein, ur: 30 mg/dL — AB
Specific Gravity, Urine: 1.026 (ref 1.005–1.030)
pH: 5 (ref 5.0–8.0)

## 2019-04-19 LAB — BASIC METABOLIC PANEL
Anion gap: 10 (ref 5–15)
Anion gap: 6 (ref 5–15)
BUN: 20 mg/dL (ref 6–20)
BUN: 19 mg/dL (ref 6–20)
CO2: 30 mmol/L (ref 22–32)
CO2: 26 mmol/L (ref 22–32)
Calcium: 9.3 mg/dL (ref 8.9–10.3)
Calcium: 8.5 mg/dL — ABNORMAL LOW (ref 8.9–10.3)
Chloride: 105 mmol/L (ref 98–111)
Chloride: 105 mmol/L (ref 98–111)
Creatinine, Ser: 1.24 mg/dL (ref 0.61–1.24)
Creatinine, Ser: 0.92 mg/dL (ref 0.61–1.24)
GFR calc Af Amer: >60 mL/min (ref >60)
GFR calc Af Amer: >60 mL/min (ref >60)
GFR calc non Af Amer: >60 mL/min (ref >60)
GFR calc non Af Amer: >60 mL/min (ref >60)
Glucose, Bld: 194 mg/dL — ABNORMAL HIGH (ref 70–99)
Glucose, Bld: 146 mg/dL — ABNORMAL HIGH (ref 70–99)
Potassium: 3.3 mmol/L — ABNORMAL LOW (ref 3.5–5.1)
Potassium: 4 mmol/L (ref 3.5–5.1)
Sodium: 145 mmol/L (ref 135–145)
Sodium: 137 mmol/L (ref 135–145)

## 2019-04-19 LAB — ETHANOL: Alcohol, Ethyl (B): <10 mg/dL (ref <10)

## 2019-04-19 LAB — SALICYLATE LEVEL: Salicylate Lvl: <7 mg/dL — ABNORMAL LOW (ref 7.0–30.0)

## 2019-04-19 LAB — CK
Total CK: 912 U/L — ABNORMAL HIGH (ref 49–397)
Total CK: 602 U/L — ABNORMAL HIGH (ref 49–397)

## 2019-04-19 LAB — RESPIRATORY PANEL BY RT PCR (FLU A&B, COVID)
Influenza A by PCR: NEGATIVE
Influenza B by PCR: NEGATIVE
SARS Coronavirus 2 by RT PCR: NEGATIVE

## 2019-04-19 LAB — ACETAMINOPHEN LEVEL: Acetaminophen (Tylenol), Serum: <10 ug/mL — ABNORMAL LOW (ref 10–30)

## 2019-04-19 LAB — MAGNESIUM: Magnesium: 2 mg/dL (ref 1.7–2.4)

## 2019-04-19 LAB — TROPONIN I (HIGH SENSITIVITY)
Troponin I (High Sensitivity): 22 ng/L — ABNORMAL HIGH (ref <18)
Troponin I (High Sensitivity): 24 ng/L — ABNORMAL HIGH (ref <18)

## 2019-04-19 MED ORDER — LACTATED RINGERS IV BOLUS
2000.0000 mL | Freq: Once | INTRAVENOUS | Status: AC
  Administered 2019-04-19: 2000 mL via INTRAVENOUS

## 2019-04-19 MED ORDER — DROPERIDOL 2.5 MG/ML IJ SOLN
2.5000 mg | Freq: Once | INTRAMUSCULAR | Status: AC
  Administered 2019-04-19: 2.5 mg via INTRAVENOUS

## 2019-04-19 MED ORDER — MIDAZOLAM HCL 2 MG/2ML IJ SOLN
INTRAMUSCULAR | Status: AC
  Filled 2019-04-19: qty 2

## 2019-04-19 MED ORDER — SODIUM CHLORIDE 0.9 % IV BOLUS
1000.0000 mL | Freq: Once | INTRAVENOUS | Status: AC
  Administered 2019-04-19: 1000 mL via INTRAVENOUS

## 2019-04-19 MED ORDER — MIDAZOLAM HCL 2 MG/2ML IJ SOLN
2.0000 mg | Freq: Once | INTRAMUSCULAR | Status: AC
  Administered 2019-04-19: 03:00:00 2 mg via INTRAVENOUS

## 2019-04-19 NOTE — ED Notes (Signed)
Per MD York Cerise, recheck labs after fluids complete.

## 2019-04-19 NOTE — ED Notes (Addendum)
Pt able to wake to voice. Oriented to person place and time. Disoriented to situation.  NAD. Waiting on repeat labs.

## 2019-04-19 NOTE — ED Notes (Signed)
Pt given phone to talk to S.O.

## 2019-04-19 NOTE — ED Notes (Addendum)
Pt assisted using urinal at bedside. Pt stood at the side of the bed with stability and is A&Ox4 at this time.

## 2019-04-19 NOTE — Discharge Instructions (Addendum)
As we discussed it is very important that you hydrate over the next couple of days. Please seek medical attention for any high fevers, chest pain, shortness of breath, change in behavior, persistent vomiting, bloody stool or any other new or concerning symptoms. Please establish care with a primary care provider.

## 2019-04-19 NOTE — ED Provider Notes (Addendum)
[facility] Emergency Department Provider Note  ____________________________________________   First MD Initiated Contact with Patient 04/19/19 [phone]     (approximate)  I have reviewed the triage vital signs and the nursing notes.   HISTORY  Chief Complaint Drug Overdose  Level 5 caveat:  history/ROS limited by acute/critical illness and probable unspecified intoxication.  HPI Marc Marks is a 44 y.o. male with medical history as listed below who was just seen recently for an apparent opioid overdose.  He presents tonight by EMS accompanied by law enforcement for bizarre behavior and presumed intoxication.  Reportedly his girlfriend called EMS to the motel they were because he took a pill thought to be Percocet for recreational use.  Shortly thereafter he began yelling and acting bizarre, sweating, and unable to respond.  The police found him screaming and thrashing around and unable to follow commands.  No Narcan was administered.  Upon arrival in the emergency department he is intermittently bucking and thrashing, yelling, making hooting noises, and occasionally seems to be briefly singing .  With loud attempts at redirection he will briefly make eye contact and apologized several times during my attempts at a history but is unable to answer any questions.        Past Medical History:  Diagnosis Date  . Diabetes mellitus without complication (HCC)   . Hypertension   . Polysubstance abuse (HCC)     There are no problems to display for this patient.   History reviewed. No pertinent surgical history.  Prior to Admission medications   Medication Sig Start Date End Date Taking? Authorizing Provider  doxycycline (VIBRAMYCIN) 100 MG capsule Take 1 capsule (100 mg total) by mouth 2 (two) times daily for 7 days. 04/17/19 04/24/19  Miguel Aschoff., MD  naloxone [facility]) nasal spray 4 mg/0.1 mL Use as needed for opiate overdose 04/17/19   Miguel Aschoff., MD     Allergies Tramadol  History reviewed. No pertinent family history.  Social History Social History   Tobacco Use  . Smoking status: Never Smoker  . Smokeless tobacco: Current User    Types: Snuff  Substance Use Topics  . Alcohol use: No  . Drug use: Yes    Comment: percoset    Review of Systems Level 5 caveat:  history/ROS limited by acute/critical illness and probable unspecified intoxication.    ____________________________________________   PHYSICAL EXAM:  ED Triage Vitals  Enc Vitals Group     BP 04/19/19 0230 130/87     Pulse Rate 04/19/19 0230 95     Resp 04/19/19 0230 (!) 9     Temp --      Temp src --      SpO2 04/19/19 0230 (!) 80 %     Weight 04/19/19 0219 82.6 kg (182 lb)     Height 04/19/19 0219 1.803 m (5\' 11" )     Head Circumference --      Peak Flow --      Pain Score --      Pain Loc --      Pain Edu? --      Excl. in GC? --      Constitutional: Awake but severely altered, in severe distress, thrashing as described above. Eyes: Conjunctivae are normal.  Pupils are nonreactive but not miotic. Head: Atraumatic. Nose: No congestion/rhinnorhea. Mouth/Throat: Chronic poor dentition. Neck: No stridor.  No meningeal signs.   Cardiovascular: Sinus tachycardia, regular rhythm. Good peripheral circulation. Grossly normal heart sounds. Respiratory:  Episodes of tachypnea followed by brief episodes of apnea. Gastrointestinal: Soft and nontender. No distention.  Musculoskeletal: No lower extremity tenderness nor edema. No gross deformities of extremities. Neurologic: Moving all 4 extremities violently and randomly but not consistent with seizure.  Unable to follow neurological exam.  Protecting airway at this time. Skin:  Skin is warm, heavily diaphoretic, and he has a number of small wounds over his arms consistent with illicit drug use.   ____________________________________________   LABS (all labs ordered are listed, but only abnormal results  are displayed)  Labs Reviewed  CK - Abnormal; Notable for the following components:      Result Value   Total CK 912 (*)    All other components within normal limits  BASIC METABOLIC PANEL - Abnormal; Notable for the following components:   Potassium 3.3 (*)    Glucose, Bld 194 (*)    All other components within normal limits  SALICYLATE LEVEL - Abnormal; Notable for the following components:   Salicylate Lvl <7.0 (*)    All other components within normal limits  ACETAMINOPHEN LEVEL - Abnormal; Notable for the following components:   Acetaminophen (Tylenol), Serum <10 (*)    All other components within normal limits  CBC WITH DIFFERENTIAL/PLATELET - Abnormal; Notable for the following components:   Monocytes Absolute 1.2 (*)    All other components within normal limits  URINALYSIS, ROUTINE W REFLEX MICROSCOPIC - Abnormal; Notable for the following components:   Color, Urine YELLOW (*)    APPearance HAZY (*)    Protein, ur 30 (*)    All other components within normal limits  URINE DRUG SCREEN, QUALITATIVE ([facility] ONLY) - Abnormal; Notable for the following components:   Amphetamines, Ur Screen POSITIVE (*)    Cocaine Metabolite,Ur [place] POSITIVE (*)    Opiate, Ur Screen POSITIVE (*)    All other components within normal limits  TROPONIN I (HIGH SENSITIVITY) - Abnormal; Notable for the following components:   Troponin I (High Sensitivity) 22 (*)    All other components within normal limits  RESPIRATORY PANEL BY RT PCR (FLU A&B, COVID)  ETHANOL  MAGNESIUM  BASIC METABOLIC PANEL  CK  TROPONIN I (HIGH SENSITIVITY)   ____________________________________________  EKG  ED ECG REPORT I, Loleta Rose, the attending physician, personally viewed and interpreted this ECG.  Date: 04/19/2019 EKG Time: 2:18 AM Rate: 112 Rhythm: Sinus tachycardia QRS Axis: normal Intervals: normal ST/T Wave abnormalities: Non-specific ST segment / T-wave changes, but no clear evidence of acute  ischemia. Narrative Interpretation: no definitive evidence of acute ischemia; does not meet STEMI criteria.   ____________________________________________  RADIOLOGY I, Loleta Rose, personally viewed and evaluated these images (plain radiographs) as part of my medical decision making, as well as reviewing the written report by the radiologist.  ED MD interpretation:  No indication for emergent imaging  Official radiology report(s): No results found.  ____________________________________________   PROCEDURES   Procedure(s) performed (including Critical Care):  .Critical Care Performed by: Loleta Rose, MD Authorized by: Loleta Rose, MD   Critical care provider statement:    Critical care time (minutes):  30   Critical care time was exclusive of:  Separately billable procedures and treating other patients   Critical care was necessary to treat or prevent imminent or life-threatening deterioration of the following conditions:  Toxidrome and CNS failure or compromise   Critical care was time spent personally by me on the following activities:  Development of treatment plan with patient or surrogate, discussions  with consultants, evaluation of patient's response to treatment, examination of patient, obtaining history from patient or surrogate, ordering and performing treatments and interventions, ordering and review of laboratory studies, ordering and review of radiographic studies, pulse oximetry, re-evaluation of patient's condition and review of old charts     ____________________________________________   INITIAL IMPRESSION / MDM / ASSESSMENT AND PLAN / ED COURSE  As part of my medical decision making, I reviewed the following data within the electronic MEDICAL RECORD NUMBER Nursing notes reviewed and incorporated, Labs reviewed , EKG interpreted , Old chart reviewed, Patient signed out to Dr. Derrill Kay, Notes from prior ED visits and [place] Controlled Substance  Database  Differential diagnosis includes, but is not limited to, amphetamine abuse, cocaine abuse, other stimulant abuse, diphenhydramine overdose, and any other unspecified ingestion, acute psychiatric illness, metabolic or electrolyte abnormality.  I observe the patient for a few minutes before making a management decision given that he was tachycardic, diaphoretic, intermittently screaming and thrashing around, and then having brief episodes of apnea where his oxygen saturation would drop into the 70s.  I prepared all the emergent airway equipment in the room and then we administered Versed 2 mg IV because I think that he primarily is suffering from a stimulant overdose.  Shortly after the administration of the Versed the patient began sonorous respirations and calm down with no more thrashing around.  He became hypoxemic but on a nonrebreather he was satting 100%.  I will continue to observe carefully but at this point I suspect that intubating him would result in a very difficult time keeping him sedated and comfortable and may put him at risk more than trying to keep him mildly sedated and calm without an advanced airway.  If he needs additional calming agent I will proceed with droperidol administration as an additional agent.  Lab work is pending including a CK given the possibility of rhabdomyolysis and other coingestions.  EKG is pending once we are sure we will be able to get an EKG to look for any evidence of QRS prolongation that would suggest another additionally dangerous ingestion such as diphenhydramine that might require sodium bicarbonate.       Clinical Course as of Apr 18 642  Mon Apr 19, 2019  0236 Patient now asleep with sonorous respirations about 7 times a minute but satting 100% on nonrebreather.  Given the complicated presentation I will allow him to maintain at his current status before deciding whether or not he needs an advanced airway or perhaps transition to nasal  cannula.   [CF]  0257 EKG is reassuring with intervals that are essentially normal.   [CF]  0304 Snoring respirations but maintaing O2 sats   [CF]  0314 CK Total(!): 912 [CF]  0321 CK is elevated but not necessarily dangerously so, I have ordered 2 L of lactated Ringer's and then will recheck a CK to see if he is trending up or down  CK Total(!): 912 [CF]  0321 Acetaminophen (Tylenol), S(!): <10 [CF]  0321 Salicylate Lvl(!): <7.0 [CF]  0321 Likely secondary to stimulant overdose, no indication of ACS  Troponin I (High Sensitivity)(!): 22 [CF]  0402 SARS Coronavirus 2 by RT PCR: NEGATIVE [CF]  0511 Patient sleeping, now on 4L [place].    [CF]  0511 Tachycardia has resolved.  CK [CF]  0542 Patient sleeping well at this time, spO2 100%.   [CF]  [phone] The patient's 2 L of fluid are still slowly going and, he has at least  500 mL left ago.  At that point he will need a repeat troponin, CK, and BMP.  He will need reassessment for clinical sobriety and to make sure there is no evidence of worsening CK elevation suggestive of rhabdomyolysis or of worsening troponin leak or of renal failure.  I anticipate discharge once his clinical status has improved.  Transferring ED care at 7 AM to Dr. Archie Balboa.   [CF]    Clinical Course User Index [CF] Hinda Kehr, MD     ____________________________________________  FINAL CLINICAL IMPRESSION(S) / ED DIAGNOSES  Final diagnoses:  Central nervous system stimulant overdose, undetermined intent, initial encounter ([place])  Elevated CK  Elevated troponin level     MEDICATIONS GIVEN DURING THIS VISIT:  Medications  midazolam (VERSED) injection 2 mg (2 mg Intravenous Given 04/19/19 0231)  lactated ringers bolus 2,000 mL (2,000 mLs Intravenous New Bag/Given 04/19/19 0328)  droperidol (INAPSINE) 2.5 MG/ML injection 2.5 mg (2.5 mg Intravenous Given 04/19/19 0349)     ED Discharge Orders    None      *Please note:  Medhansh Brinkmeier was evaluated in Emergency  Department on 04/19/2019 for the symptoms described in the history of present illness. He was evaluated in the context of the global COVID-19 pandemic, which necessitated consideration that the patient might be at risk for infection with the SARS-CoV-2 virus that causes COVID-19. Institutional protocols and algorithms that pertain to the evaluation of patients at risk for COVID-19 are in a state of rapid change based on information released by regulatory bodies including the CDC and federal and state organizations. These policies and algorithms were followed during the patient's care in the ED.  Some ED evaluations and interventions may be delayed as a result of limited staffing during the pandemic.*  Note:  This document was prepared using Dragon voice recognition software and may include unintentional dictation errors.   Hinda Kehr, MD 04/19/19 4163    Hinda Kehr, MD 04/19/19 [phone]

## 2019-04-19 NOTE — ED Notes (Signed)
Pt placed on nonrebreather.  

## 2019-04-19 NOTE — ED Notes (Signed)
This tech at bedside as 1:1 Recruitment consultant. Pt sleeping at this time; will continue to remain at bedside.

## 2019-04-19 NOTE — ED Triage Notes (Signed)
Pt arrives via [facility] with c/o assumed drug overdose. Pt unable to answer triage questions at this time.

## 2019-04-19 NOTE — ED Notes (Signed)
Pt remains sleeping at this time. This tech as 1:1 Recruitment consultant at bedside.

## 2019-04-19 NOTE — ED Notes (Signed)
Pt remains asleep.  NAD.  Waiting on repeat lab work.  Will have him call fiancee when he wakes

## 2019-06-13 ENCOUNTER — Other Ambulatory Visit: Payer: Self-pay

## 2019-06-13 ENCOUNTER — Emergency Department
Admission: EM | Admit: 2019-06-13 | Discharge: 2019-06-14 | Disposition: A | Discharge status: Home / Self Care | Payer: Self-pay | Attending: Emergency Medicine | Admitting: Emergency Medicine

## 2019-06-13 ENCOUNTER — Encounter: Payer: Self-pay | Admitting: Emergency Medicine

## 2019-06-13 DIAGNOSIS — F1910 Other psychoactive substance abuse, uncomplicated: Secondary | ICD-10-CM | POA: Insufficient documentation

## 2019-06-13 DIAGNOSIS — Z7984 Long term (current) use of oral hypoglycemic drugs: Secondary | ICD-10-CM | POA: Insufficient documentation

## 2019-06-13 DIAGNOSIS — R258 Other abnormal involuntary movements: Secondary | ICD-10-CM | POA: Insufficient documentation

## 2019-06-13 DIAGNOSIS — I10 Essential (primary) hypertension: Secondary | ICD-10-CM | POA: Insufficient documentation

## 2019-06-13 DIAGNOSIS — F17220 Nicotine dependence, chewing tobacco, uncomplicated: Secondary | ICD-10-CM | POA: Insufficient documentation

## 2019-06-13 DIAGNOSIS — L03113 Cellulitis of right upper limb: Secondary | ICD-10-CM | POA: Insufficient documentation

## 2019-06-13 DIAGNOSIS — E119 Type 2 diabetes mellitus without complications: Secondary | ICD-10-CM | POA: Insufficient documentation

## 2019-06-13 NOTE — ED Notes (Signed)
Pt given phone to call for a ride.  

## 2019-06-13 NOTE — ED Notes (Signed)
Dr Scotty Court to the bedside.
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
## 2019-06-13 NOTE — ED Triage Notes (Signed)
EMS pt to rm 23 from home with report of called to scene by [facility] to transport pt to ER as he is "detoxing from something". EMS report pt did not speak with them . Pt has twitching to his arms and legs noted by this RN.

## 2019-06-14 MED ORDER — SULFAMETHOXAZOLE-TRIMETHOPRIM 800-160 MG PO TABS: 1.0000 | ORAL_TABLET | Freq: Two times a day (BID) | ORAL | 0 refills | Status: DC

## 2019-06-14 MED ORDER — CEPHALEXIN 500 MG PO CAPS: 500.0000 mg | ORAL_CAPSULE | Freq: Three times a day (TID) | ORAL | 0 refills | Status: DC
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
## 2019-07-30 ENCOUNTER — Emergency Department (HOSPITAL_COMMUNITY)
Admission: EM | Admit: 2019-07-30 | Discharge: 2019-07-31 | Disposition: A | Discharge status: Home / Self Care | Payer: Self-pay | Attending: Emergency Medicine | Admitting: Emergency Medicine

## 2019-07-30 ENCOUNTER — Other Ambulatory Visit: Payer: Self-pay

## 2019-07-30 DIAGNOSIS — Z7984 Long term (current) use of oral hypoglycemic drugs: Secondary | ICD-10-CM | POA: Insufficient documentation

## 2019-07-30 DIAGNOSIS — E119 Type 2 diabetes mellitus without complications: Secondary | ICD-10-CM | POA: Insufficient documentation

## 2019-07-30 DIAGNOSIS — F17290 Nicotine dependence, other tobacco product, uncomplicated: Secondary | ICD-10-CM | POA: Insufficient documentation

## 2019-07-30 DIAGNOSIS — I10 Essential (primary) hypertension: Secondary | ICD-10-CM | POA: Insufficient documentation

## 2019-07-30 DIAGNOSIS — F1910 Other psychoactive substance abuse, uncomplicated: Secondary | ICD-10-CM | POA: Insufficient documentation

## 2019-07-30 DIAGNOSIS — R4689 Other symptoms and signs involving appearance and behavior: Secondary | ICD-10-CM

## 2019-07-30 DIAGNOSIS — R451 Restlessness and agitation: Secondary | ICD-10-CM | POA: Insufficient documentation

## 2019-07-30 LAB — COMPREHENSIVE METABOLIC PANEL
ALT: 19 U/L (ref 0–44)
AST: 25 U/L (ref 15–41)
Albumin: 4.1 g/dL (ref 3.5–5.0)
Alkaline Phosphatase: 56 U/L (ref 38–126)
Anion gap: 14 (ref 5–15)
BUN: 9 mg/dL (ref 6–20)
CO2: 28 mmol/L (ref 22–32)
Calcium: 9.2 mg/dL (ref 8.9–10.3)
Chloride: 108 mmol/L (ref 98–111)
Creatinine, Ser: 1.37 mg/dL — ABNORMAL HIGH (ref 0.61–1.24)
GFR calc Af Amer: >60 mL/min (ref >60)
GFR calc non Af Amer: >60 mL/min (ref >60)
Glucose, Bld: 181 mg/dL — ABNORMAL HIGH (ref 70–99)
Potassium: 3.4 mmol/L — ABNORMAL LOW (ref 3.5–5.1)
Sodium: 150 mmol/L — ABNORMAL HIGH (ref 135–145)
Total Bilirubin: 1.9 mg/dL — ABNORMAL HIGH (ref 0.3–1.2)
Total Protein: 7.1 g/dL (ref 6.5–8.1)

## 2019-07-30 LAB — RAPID URINE DRUG SCREEN, HOSP PERFORMED
Amphetamines: NOT DETECTED
Barbiturates: NOT DETECTED
Benzodiazepines: NOT DETECTED
Cocaine: POSITIVE — AB
Opiates: POSITIVE — AB
Tetrahydrocannabinol: POSITIVE — AB

## 2019-07-30 LAB — CBC WITH DIFFERENTIAL/PLATELET
Abs Immature Granulocytes: 0.06 10*3/uL (ref 0.00–0.07)
Basophils Absolute: 0.1 10*3/uL (ref 0.0–0.1)
Basophils Relative: 1 %
Eosinophils Absolute: 0.1 10*3/uL (ref 0.0–0.5)
Eosinophils Relative: 1 %
HCT: 42.3 % (ref 39.0–52.0)
Hemoglobin: 13.8 g/dL (ref 13.0–17.0)
Immature Granulocytes: 1 %
Lymphocytes Relative: 15 %
Lymphs Abs: 2 10*3/uL (ref 0.7–4.0)
MCH: 29.7 pg (ref 26.0–34.0)
MCHC: 32.6 g/dL (ref 30.0–36.0)
MCV: 91 fL (ref 80.0–100.0)
Monocytes Absolute: 1.3 10*3/uL — ABNORMAL HIGH (ref 0.1–1.0)
Monocytes Relative: 10 %
Neutro Abs: 9.4 10*3/uL — ABNORMAL HIGH (ref 1.7–7.7)
Neutrophils Relative %: 72 %
Platelets: 273 10*3/uL (ref 150–400)
RBC: 4.65 MIL/uL (ref 4.22–5.81)
RDW: 14.8 % (ref 11.5–15.5)
WBC: 12.8 10*3/uL — ABNORMAL HIGH (ref 4.0–10.5)
nRBC: 0 % (ref 0.0–0.2)

## 2019-07-30 LAB — URINALYSIS, ROUTINE W REFLEX MICROSCOPIC
Bilirubin Urine: NEGATIVE
Glucose, UA: 50 mg/dL — AB
Hgb urine dipstick: NEGATIVE
Ketones, ur: NEGATIVE mg/dL
Leukocytes,Ua: NEGATIVE
Nitrite: NEGATIVE
Protein, ur: NEGATIVE mg/dL
Specific Gravity, Urine: 1.01 (ref 1.005–1.030)
pH: 5 (ref 5.0–8.0)

## 2019-07-30 MED ORDER — DIPHENHYDRAMINE HCL 50 MG/ML IJ SOLN
50.0000 mg | Freq: Once | INTRAMUSCULAR | Status: AC
  Administered 2019-07-30: 50 mg via INTRAVENOUS
  Filled 2019-07-30: qty 1

## 2019-07-30 MED ORDER — SODIUM CHLORIDE 0.9 % IV BOLUS
500.0000 mL | Freq: Once | INTRAVENOUS | Status: AC
  Administered 2019-07-30: 500 mL via INTRAVENOUS

## 2019-07-30 MED ORDER — LORAZEPAM 2 MG/ML IJ SOLN
2.0000 mg | Freq: Once | INTRAMUSCULAR | Status: AC
  Administered 2019-07-30: 2 mg via INTRAMUSCULAR
  Filled 2019-07-30: qty 1

## 2019-07-30 NOTE — ED Provider Notes (Addendum)
[facility] EMERGENCY DEPARTMENT Provider Note   CSN: [id number] Arrival date & time: 07/30/19  1907     History No chief complaint on file.   Marc Marks is a 44 y.o. male.  HPI Patient presents by law enforcement, because he was found with reported "disruptive behavior."  Patient states that he lives in [place], and his friend brought him to [place] then abandoned him.  He is unsure what happened after that, and tell the law enforcement decided to bring him here.  He denies using drugs today.  He denies headache, fever, chills, weakness or dizziness.  He denies similar problems previously.  There are no other known modifying factors.    Past Medical History:  Diagnosis Date  . Diabetes mellitus without complication (HCC)   . Hypertension   . Polysubstance abuse (HCC)     There are no problems to display for this patient.   No past surgical history on file.     No family history on file.  Social History   Tobacco Use  . Smoking status: Never Smoker  . Smokeless tobacco: Current User    Types: Snuff  Substance Use Topics  . Alcohol use: No  . Drug use: Yes    Comment: percoset    Home Medications Prior to Admission medications   Medication Sig Start Date End Date Taking? Authorizing Provider  naloxone [facility]) nasal spray 4 mg/0.1 mL Use as needed for opiate overdose 04/17/19   Miguel Aschoff., MD    Allergies    Tramadol  Review of Systems   Review of Systems  All other systems reviewed and are negative.   Physical Exam Updated Vital Signs BP (!) 139/99   Pulse 85   Temp 98 F (36.7 C) (Oral)   Resp 10   Ht 5\' 11"  (1.803 m)   Wt 83.9 kg   SpO2 97%   BMI 25.80 kg/m   Physical Exam Vitals and nursing note reviewed.  Constitutional:      General: He is in acute distress.     Appearance: He is well-developed. He is ill-appearing and diaphoretic. He is not toxic-appearing.  HENT:     Head: Normocephalic and atraumatic.       Right Ear: External ear normal.     Left Ear: External ear normal.  Eyes:     Conjunctiva/sclera: Conjunctivae normal.     Pupils: Pupils are equal, round, and reactive to light.  Neck:     Trachea: Phonation normal.  Cardiovascular:     Rate and Rhythm: Regular rhythm. Tachycardia present.     Heart sounds: Normal heart sounds.  Pulmonary:     Effort: Pulmonary effort is normal.     Breath sounds: Normal breath sounds.  Abdominal:     Palpations: Abdomen is soft.     Tenderness: There is no abdominal tenderness.  Musculoskeletal:        General: Normal range of motion.     Cervical back: Normal range of motion and neck supple.  Skin:    General: Skin is warm.  Neurological:     Mental Status: He is alert and oriented to person, place, and time.     GCS: GCS eye subscore is 4. GCS verbal subscore is 5. GCS motor subscore is 6.     Cranial Nerves: No cranial nerve deficit.     Motor: No abnormal muscle tone.     Comments: Nonrhythmic involuntary vocalizations (unintelligible words), and ballistic movements of arms  and legs.  Occasional stiffening of whole body, for brief periods.  The patient is able to follow commands to be quiet, and was able to hold his left arm still, for IV insertion.  Psychiatric:        Mood and Affect: Mood is anxious.        Speech: Speech is rapid and pressured.        Behavior: Behavior is agitated.        Thought Content: Thought content is not delusional. Thought content does not include homicidal or suicidal plan.        Cognition and Memory: Cognition is impaired.        Judgment: Judgment is impulsive and inappropriate.     ED Results / Procedures / Treatments   Labs (all labs ordered are listed, but only abnormal results are displayed) Labs Reviewed  COMPREHENSIVE METABOLIC PANEL - Abnormal; Notable for the following components:      Result Value   Sodium 150 (*)    Potassium 3.4 (*)    Glucose, Bld 181 (*)    Creatinine, Ser 1.37  (*)    Total Bilirubin 1.9 (*)    All other components within normal limits  CBC WITH DIFFERENTIAL/PLATELET - Abnormal; Notable for the following components:   WBC 12.8 (*)    Neutro Abs 9.4 (*)    Monocytes Absolute 1.3 (*)    All other components within normal limits  URINALYSIS, ROUTINE W REFLEX MICROSCOPIC - Abnormal; Notable for the following components:   APPearance HAZY (*)    Glucose, UA 50 (*)    All other components within normal limits  RAPID URINE DRUG SCREEN, HOSP PERFORMED - Abnormal; Notable for the following components:   Opiates POSITIVE (*)    Cocaine POSITIVE (*)    Tetrahydrocannabinol POSITIVE (*)    All other components within normal limits    EKG EKG Interpretation  Date/Time:  Friday Jul 30 2019 20:07:51 EDT Ventricular Rate:  103 PR Interval:    QRS Duration: 87 QT Interval:  323 QTC Calculation: 423 R Axis:   30 Text Interpretation: Sinus tachycardia Left ventricular hypertrophy Nonspecific T abnormalities, inferior leads Baseline wander in lead(s) III aVL V1 V3 V4 Since last tracing QT has shortened Otherwise no significant change Confirmed by Mancel Bale [id number]) on 07/30/2019 8:29:17 PM   Radiology No results found.  Procedures .Critical Care Performed by: Mancel Bale, MD Authorized by: Mancel Bale, MD   Critical care provider statement:    Critical care time (minutes):  55   Critical care start time:  07/30/2019 9:10 AM   Critical care end time:  07/30/2019 11:15 PM   Critical care time was exclusive of:  Separately billable procedures and treating other patients   Critical care was time spent personally by me on the following activities:  Blood draw for specimens, development of treatment plan with patient or surrogate, discussions with consultants, evaluation of patient's response to treatment, examination of patient, obtaining history from patient or surrogate, ordering and performing treatments and interventions, ordering and review  of laboratory studies, pulse oximetry, re-evaluation of patient's condition, review of old charts and ordering and review of radiographic studies   (including critical care time)  Medications Ordered in ED Medications  LORazepam (ATIVAN) injection 2 mg (2 mg Intramuscular Given 07/30/19 1949)  diphenhydrAMINE (BENADRYL) injection 50 mg (50 mg Intravenous Given 07/30/19 1949)  sodium chloride 0.9 % bolus 500 mL (0 mLs Intravenous Stopped 07/30/19 2103)    ED  Course  I have reviewed the triage vital signs and the nursing notes.  Pertinent labs & imaging results that were available during my care of the patient were reviewed by me and considered in my medical decision making (see chart for details).  Clinical Course as of Jul 30 2251  Fri Jul 30, 2019  2039 Normal except sodium high, potassium low, glucose high, creatinine high, total bilirubin high  Comprehensive metabolic panel(!) [EW]  1914 Normal except presence of glucose  Urinalysis, Routine w reflex microscopic(!) [EW]  2039 Normal except white count high  CBC with Differential(!) [EW]  2220 Elevated sodium, low potassium, elevated total bilirubin  GFR, Est Non African American: >60 [EW]  2221 RDW: 14.8 [EW]  2221 GFR, Est African American: >60 [EW]  2252 Patient is currently sleeping, he arouses but was too sleepy converse.  He then went back to sleep.   [EW]    Clinical Course User Index [EW] Daleen Bo, MD   MDM Rules/Calculators/A&P                       Patient Vitals for the past 24 hrs:  BP Temp Temp src Pulse Resp SpO2 Height Weight  07/30/19 2200 (!) 139/99 -- -- 85 10 97 % -- --  07/30/19 2145 (!) 143/97 -- -- 92 10 95 % -- --  07/30/19 2130 134/88 -- -- 83 10 94 % -- --  07/30/19 2115 138/90 -- -- 85 11 95 % -- --  07/30/19 2100 133/89 -- -- 88 11 94 % -- --  07/30/19 2045 (!) 138/98 -- -- 91 11 94 % -- --  07/30/19 2030 (!) 140/96 -- -- 92 11 94 % -- --  07/30/19 2015 (!) 148/101 -- -- 95 11 93 % -- --   07/30/19 1952 (!) 143/114 98 F (36.7 C) Oral (!) 144 18 94 % -- --  07/30/19 1935 -- -- -- -- -- -- 5\' 11"  (1.803 m) 83.9 kg    Medical Decision Making:  This patient is presenting for evaluation of unusual behavior, which does require a range of treatment options, and is a complaint that involves a moderate risk of morbidity and mortality. The differential diagnoses include drug intoxication, drug withdrawal, medication reaction. I decided to review old records, and in summary relatively healthy middle-aged male with history of substance abuse hypertension diabetes.  I did not require additional historical information from anyone.  Clinical Laboratory Tests Ordered, included CBC, Metabolic panel, Urinalysis and Urine drug screen,. Review indicates elevated sodium, glucose high, creatinine high, total bilirubin high, white count high, urine drug screen abnormal with presence of cocaine, opiates and THC.    Cardiac Monitor Tracing which shows normal sinus rhythm    Critical Interventions-on presentation patient was difficult to manage, had trouble staying in the bed, and had the potential for to injure self and staff.  He was therefore medicated with Ativan and Benadryl.  I initially suspected an extrapyramidal system response to probable drug ingestion.  After he was given Ativan and Benadryl, he went to sleep and remained calm/sedated.  Screening labs obtained indicating polysubstance abuse.  Patient requires observation until becoming sober to reassess and determine course of care.  After These Interventions, the Patient was reevaluated and was found to require persistent observation until sobriety  CRITICAL CARE-yes Performed by: Daleen Bo  Nursing Notes Reviewed/ Care Coordinated Applicable Imaging Reviewed Interpretation of Laboratory Data incorporated into ED treatment  Disposition by oncoming provider  team after period of observation to metabolize substances.   Final  Clinical Impression(s) / ED Diagnoses Final diagnoses:  Polysubstance abuse (HCC)  Abnormal behavior    Rx / DC Orders ED Discharge Orders    None       Mancel Bale, MD 07/30/19 2317    Mancel Bale, MD 08/08/19 1134

## 2019-07-30 NOTE — ED Notes (Signed)
Patient is resting comfortably. 

## 2019-07-30 NOTE — ED Triage Notes (Signed)
Pt BIB [employer] d/t disruptive behavior. Pt presents to ED tremors. Denies drug use, denies ETOH, denies SI/HI

## 2019-07-31 ENCOUNTER — Emergency Department (HOSPITAL_COMMUNITY): Payer: Self-pay

## 2019-07-31 ENCOUNTER — Inpatient Hospital Stay (HOSPITAL_COMMUNITY)
Admission: EM | Admit: 2019-07-31 | Discharge: 2019-08-02 | DRG: 917 | Disposition: A | Discharge status: Home / Self Care | Payer: Self-pay | Attending: Critical Care Medicine | Admitting: Critical Care Medicine

## 2019-07-31 DIAGNOSIS — T407X1A Poisoning by cannabis (derivatives), accidental (unintentional), initial encounter: Secondary | ICD-10-CM | POA: Diagnosis present

## 2019-07-31 DIAGNOSIS — E119 Type 2 diabetes mellitus without complications: Secondary | ICD-10-CM | POA: Diagnosis present

## 2019-07-31 DIAGNOSIS — E876 Hypokalemia: Secondary | ICD-10-CM | POA: Diagnosis present

## 2019-07-31 DIAGNOSIS — G4700 Insomnia, unspecified: Secondary | ICD-10-CM | POA: Diagnosis present

## 2019-07-31 DIAGNOSIS — T50904D Poisoning by unspecified drugs, medicaments and biological substances, undetermined, subsequent encounter: Secondary | ICD-10-CM

## 2019-07-31 DIAGNOSIS — F1910 Other psychoactive substance abuse, uncomplicated: Secondary | ICD-10-CM

## 2019-07-31 DIAGNOSIS — T50901A Poisoning by unspecified drugs, medicaments and biological substances, accidental (unintentional), initial encounter: Secondary | ICD-10-CM

## 2019-07-31 DIAGNOSIS — T40601A Poisoning by unspecified narcotics, accidental (unintentional), initial encounter: Secondary | ICD-10-CM | POA: Diagnosis present

## 2019-07-31 DIAGNOSIS — R092 Respiratory arrest: Secondary | ICD-10-CM

## 2019-07-31 DIAGNOSIS — I10 Essential (primary) hypertension: Secondary | ICD-10-CM | POA: Diagnosis present

## 2019-07-31 DIAGNOSIS — J9600 Acute respiratory failure, unspecified whether with hypoxia or hypercapnia: Secondary | ICD-10-CM | POA: Diagnosis present

## 2019-07-31 DIAGNOSIS — Z20822 Contact with and (suspected) exposure to covid-19: Secondary | ICD-10-CM | POA: Diagnosis present

## 2019-07-31 DIAGNOSIS — T405X1A Poisoning by cocaine, accidental (unintentional), initial encounter: Principal | ICD-10-CM | POA: Diagnosis present

## 2019-07-31 DIAGNOSIS — E870 Hyperosmolality and hypernatremia: Secondary | ICD-10-CM | POA: Diagnosis present

## 2019-07-31 DIAGNOSIS — D649 Anemia, unspecified: Secondary | ICD-10-CM | POA: Diagnosis present

## 2019-07-31 DIAGNOSIS — G92 Toxic encephalopathy: Secondary | ICD-10-CM | POA: Diagnosis present

## 2019-07-31 DIAGNOSIS — F17220 Nicotine dependence, chewing tobacco, uncomplicated: Secondary | ICD-10-CM | POA: Diagnosis present

## 2019-07-31 DIAGNOSIS — T424X1A Poisoning by benzodiazepines, accidental (unintentional), initial encounter: Secondary | ICD-10-CM | POA: Diagnosis present

## 2019-07-31 LAB — CBC
HCT: 36.8 % — ABNORMAL LOW (ref 39.0–52.0)
Hemoglobin: 12 g/dL — ABNORMAL LOW (ref 13.0–17.0)
MCH: 29.4 pg (ref 26.0–34.0)
MCHC: 32.6 g/dL (ref 30.0–36.0)
MCV: 90.2 fL (ref 80.0–100.0)
Platelets: 216 10*3/uL (ref 150–400)
RBC: 4.08 MIL/uL — ABNORMAL LOW (ref 4.22–5.81)
RDW: 15 % (ref 11.5–15.5)
WBC: 8.5 10*3/uL (ref 4.0–10.5)
nRBC: 0 % (ref 0.0–0.2)

## 2019-07-31 LAB — CREATININE, SERUM
Creatinine, Ser: 1.07 mg/dL (ref 0.61–1.24)
GFR calc Af Amer: >60 mL/min (ref >60)
GFR calc non Af Amer: >60 mL/min (ref >60)

## 2019-07-31 LAB — COMPREHENSIVE METABOLIC PANEL
ALT: 18 U/L (ref 0–44)
AST: 33 U/L (ref 15–41)
Albumin: 3.6 g/dL (ref 3.5–5.0)
Alkaline Phosphatase: 50 U/L (ref 38–126)
Anion gap: 13 (ref 5–15)
BUN: 11 mg/dL (ref 6–20)
CO2: 25 mmol/L (ref 22–32)
Calcium: 8.5 mg/dL — ABNORMAL LOW (ref 8.9–10.3)
Chloride: 111 mmol/L (ref 98–111)
Creatinine, Ser: 1.21 mg/dL (ref 0.61–1.24)
GFR calc Af Amer: >60 mL/min (ref >60)
GFR calc non Af Amer: >60 mL/min (ref >60)
Glucose, Bld: 124 mg/dL — ABNORMAL HIGH (ref 70–99)
Potassium: 3.2 mmol/L — ABNORMAL LOW (ref 3.5–5.1)
Sodium: 149 mmol/L — ABNORMAL HIGH (ref 135–145)
Total Bilirubin: 1.8 mg/dL — ABNORMAL HIGH (ref 0.3–1.2)
Total Protein: 6.3 g/dL — ABNORMAL LOW (ref 6.5–8.1)

## 2019-07-31 LAB — POCT I-STAT 7, (LYTES, BLD GAS, ICA,H+H)
Acid-Base Excess: 6 mmol/L — ABNORMAL HIGH (ref 0.0–2.0)
Bicarbonate: 31.6 mmol/L — ABNORMAL HIGH (ref 20.0–28.0)
Calcium, Ion: 1.23 mmol/L (ref 1.15–1.40)
HCT: 34 % — ABNORMAL LOW (ref 39.0–52.0)
Hemoglobin: 11.6 g/dL — ABNORMAL LOW (ref 13.0–17.0)
O2 Saturation: 100 %
Patient temperature: 98.6
Potassium: 3.2 mmol/L — ABNORMAL LOW (ref 3.5–5.1)
Sodium: 147 mmol/L — ABNORMAL HIGH (ref 135–145)
TCO2: 33 mmol/L — ABNORMAL HIGH (ref 22–32)
pCO2 arterial: 51.6 mmHg — ABNORMAL HIGH (ref 32.0–48.0)
pH, Arterial: 7.394 (ref 7.350–7.450)
pO2, Arterial: 366 mmHg — ABNORMAL HIGH (ref 83.0–108.0)

## 2019-07-31 LAB — RAPID URINE DRUG SCREEN, HOSP PERFORMED
Amphetamines: NOT DETECTED
Barbiturates: NOT DETECTED
Benzodiazepines: POSITIVE — AB
Cocaine: POSITIVE — AB
Opiates: POSITIVE — AB
Tetrahydrocannabinol: POSITIVE — AB

## 2019-07-31 LAB — CBC WITH DIFFERENTIAL/PLATELET
Abs Immature Granulocytes: 0.05 10*3/uL (ref 0.00–0.07)
Basophils Absolute: 0.1 10*3/uL (ref 0.0–0.1)
Basophils Relative: 1 %
Eosinophils Absolute: 0.1 10*3/uL (ref 0.0–0.5)
Eosinophils Relative: 1 %
HCT: 37.5 % — ABNORMAL LOW (ref 39.0–52.0)
Hemoglobin: 12.3 g/dL — ABNORMAL LOW (ref 13.0–17.0)
Immature Granulocytes: 1 %
Lymphocytes Relative: 12 %
Lymphs Abs: 1.1 10*3/uL (ref 0.7–4.0)
MCH: 30.2 pg (ref 26.0–34.0)
MCHC: 32.8 g/dL (ref 30.0–36.0)
MCV: 92.1 fL (ref 80.0–100.0)
Monocytes Absolute: 0.8 10*3/uL (ref 0.1–1.0)
Monocytes Relative: 9 %
Neutro Abs: 6.8 10*3/uL (ref 1.7–7.7)
Neutrophils Relative %: 76 %
Platelets: 224 10*3/uL (ref 150–400)
RBC: 4.07 MIL/uL — ABNORMAL LOW (ref 4.22–5.81)
RDW: 14.9 % (ref 11.5–15.5)
WBC: 8.9 10*3/uL (ref 4.0–10.5)
nRBC: 0 % (ref 0.0–0.2)

## 2019-07-31 LAB — CBG MONITORING, ED: Glucose-Capillary: 116 mg/dL — ABNORMAL HIGH (ref 70–99)

## 2019-07-31 LAB — TROPONIN I (HIGH SENSITIVITY)
Troponin I (High Sensitivity): 25 ng/L — ABNORMAL HIGH (ref <18)
Troponin I (High Sensitivity): 31 ng/L — ABNORMAL HIGH (ref <18)

## 2019-07-31 LAB — ETHANOL: Alcohol, Ethyl (B): <10 mg/dL (ref <10)

## 2019-07-31 LAB — HIV ANTIBODY (ROUTINE TESTING W REFLEX): HIV Screen 4th Generation wRfx: NONREACTIVE

## 2019-07-31 LAB — MRSA PCR SCREENING: MRSA by PCR: POSITIVE — AB

## 2019-07-31 LAB — SARS CORONAVIRUS 2 BY RT PCR (HOSPITAL ORDER, PERFORMED IN ~~LOC~~ HOSPITAL LAB): SARS Coronavirus 2: NEGATIVE

## 2019-07-31 LAB — GLUCOSE, CAPILLARY
Glucose-Capillary: 108 mg/dL — ABNORMAL HIGH (ref 70–99)
Glucose-Capillary: 81 mg/dL (ref 70–99)
Glucose-Capillary: 83 mg/dL (ref 70–99)

## 2019-07-31 LAB — HEMOGLOBIN A1C
Hgb A1c MFr Bld: 6.8 % — ABNORMAL HIGH (ref 4.8–5.6)
Mean Plasma Glucose: 148.46 mg/dL

## 2019-07-31 LAB — SALICYLATE LEVEL: Salicylate Lvl: <7 mg/dL — ABNORMAL LOW (ref 7.0–30.0)

## 2019-07-31 LAB — ACETAMINOPHEN LEVEL: Acetaminophen (Tylenol), Serum: <10 ug/mL — ABNORMAL LOW (ref 10–30)

## 2019-07-31 MED ORDER — FENTANYL CITRATE (PF) 100 MCG/2ML IJ SOLN: 25.0000 ug | INTRAMUSCULAR | Status: DC | PRN

## 2019-07-31 MED ORDER — DOCUSATE SODIUM 100 MG PO CAPS: 100.0000 mg | ORAL_CAPSULE | Freq: Two times a day (BID) | ORAL | Status: DC | PRN

## 2019-07-31 MED ORDER — PROPOFOL 1000 MG/100ML IV EMUL
INTRAVENOUS | Status: AC
  Administered 2019-07-31: 35 ug/kg/min
  Filled 2019-07-31: qty 100

## 2019-07-31 MED ORDER — LACTATED RINGERS IV SOLN: INTRAVENOUS | Status: DC

## 2019-07-31 MED ORDER — ONDANSETRON HCL 4 MG/2ML IJ SOLN: 4.0000 mg | Freq: Four times a day (QID) | INTRAMUSCULAR | Status: DC | PRN

## 2019-07-31 MED ORDER — FENTANYL CITRATE (PF) 100 MCG/2ML IJ SOLN
100.0000 ug | Freq: Once | INTRAMUSCULAR | Status: AC
  Administered 2019-07-31: 100 ug via INTRAVENOUS

## 2019-07-31 MED ORDER — ORAL CARE MOUTH RINSE
15.0000 mL | OROMUCOSAL | Status: DC
  Administered 2019-07-31 – 2019-08-01 (×10): 15 mL via OROMUCOSAL

## 2019-07-31 MED ORDER — ETOMIDATE 2 MG/ML IV SOLN
INTRAVENOUS | Status: AC | PRN
  Administered 2019-07-31: 20 mg via INTRAVENOUS

## 2019-07-31 MED ORDER — INSULIN ASPART 100 UNIT/ML ~~LOC~~ SOLN
0.0000 [IU] | SUBCUTANEOUS | Status: DC
  Administered 2019-08-01 – 2019-08-02 (×2): 1 [IU] via SUBCUTANEOUS

## 2019-07-31 MED ORDER — POLYETHYLENE GLYCOL 3350 17 G PO PACK: 17.0000 g | PACK | Freq: Every day | ORAL | Status: DC | PRN

## 2019-07-31 MED ORDER — ROCURONIUM BROMIDE 50 MG/5ML IV SOLN
INTRAVENOUS | Status: AC | PRN
  Administered 2019-07-31: 80 mg via INTRAVENOUS

## 2019-07-31 MED ORDER — ENOXAPARIN SODIUM 40 MG/0.4ML ~~LOC~~ SOLN
40.0000 mg | SUBCUTANEOUS | Status: DC
  Administered 2019-08-01 – 2019-08-02 (×2): 40 mg via SUBCUTANEOUS
  Filled 2019-07-31 (×2): qty 0.4

## 2019-07-31 MED ORDER — ACETAMINOPHEN 325 MG PO TABS
650.0000 mg | ORAL_TABLET | ORAL | Status: DC | PRN
  Administered 2019-08-01: 650 mg via ORAL
  Filled 2019-07-31: qty 2

## 2019-07-31 MED ORDER — PROPOFOL 1000 MG/100ML IV EMUL
INTRAVENOUS | Status: AC
  Filled 2019-07-31: qty 100

## 2019-07-31 MED ORDER — DOCUSATE SODIUM 50 MG/5ML PO LIQD
100.0000 mg | Freq: Two times a day (BID) | ORAL | Status: DC
  Administered 2019-07-31 – 2019-08-01 (×2): 100 mg via ORAL
  Filled 2019-07-31 (×3): qty 10

## 2019-07-31 MED ORDER — POTASSIUM CHLORIDE 20 MEQ/15ML (10%) PO SOLN
40.0000 meq | Freq: Once | ORAL | Status: AC
  Administered 2019-07-31: 40 meq
  Filled 2019-07-31: qty 30

## 2019-07-31 MED ORDER — POLYETHYLENE GLYCOL 3350 17 G PO PACK
17.0000 g | PACK | Freq: Every day | ORAL | Status: DC
  Administered 2019-08-01: 17 g via ORAL
  Filled 2019-07-31 (×2): qty 1

## 2019-07-31 MED ORDER — FENTANYL CITRATE (PF) 100 MCG/2ML IJ SOLN
INTRAMUSCULAR | Status: AC
  Filled 2019-07-31: qty 2

## 2019-07-31 MED ORDER — PANTOPRAZOLE SODIUM 40 MG IV SOLR
40.0000 mg | Freq: Every day | INTRAVENOUS | Status: DC
  Administered 2019-07-31 – 2019-08-01 (×2): 40 mg via INTRAVENOUS
  Filled 2019-07-31 (×2): qty 40

## 2019-07-31 MED ORDER — PROPOFOL 1000 MG/100ML IV EMUL
5.0000 ug/kg/min | INTRAVENOUS | Status: DC
  Administered 2019-07-31: 10 ug/kg/min via INTRAVENOUS
  Administered 2019-07-31: 45 ug/kg/min via INTRAVENOUS
  Administered 2019-07-31 – 2019-08-01 (×2): 35 ug/kg/min via INTRAVENOUS
  Administered 2019-08-01: 60 ug/kg/min via INTRAVENOUS
  Administered 2019-08-01: 65 ug/kg/min via INTRAVENOUS
  Filled 2019-07-31 (×6): qty 100

## 2019-07-31 MED ORDER — CHLORHEXIDINE GLUCONATE 0.12% ORAL RINSE (MEDLINE KIT)
15.0000 mL | Freq: Two times a day (BID) | OROMUCOSAL | Status: DC
  Administered 2019-07-31 – 2019-08-01 (×3): 15 mL via OROMUCOSAL

## 2019-07-31 MED ORDER — CHLORHEXIDINE GLUCONATE CLOTH 2 % EX PADS
6.0000 | MEDICATED_PAD | Freq: Every day | CUTANEOUS | Status: DC
  Administered 2019-07-31: 6 via TOPICAL

## 2019-07-31 NOTE — ED Provider Notes (Signed)
[place] EMERGENCY DEPARTMENT Provider Note   CSN: [id number] Arrival date & time: 07/31/19  0422     History Chief Complaint  Patient presents with  . Drug Overdose    Marc Marks is a 44 y.o. male.  The history is provided by the EMS personnel and the police. The history is limited by the condition of the patient.  Drug Overdose Episode onset: unknown but since discharge at 11 pm  The problem occurs constantly. The problem has not changed since onset.Pertinent negatives include no chest pain and no abdominal pain. Nothing aggravates the symptoms. Nothing relieves the symptoms. He has tried nothing for the symptoms. The treatment provided no relief.  Patient was here earlier this evening having been left in [place] by people that brought him here to do drugs.  Was watched in the ED.  Was reportedly no SI or HI.  Was     Past Medical History:  Diagnosis Date  . Diabetes mellitus without complication ([place])   . Hypertension   . Polysubstance abuse ([place])     There are no problems to display for this patient.   No past surgical history on file.     No family history on file.  Social History   Tobacco Use  . Smoking status: Never Smoker  . Smokeless tobacco: Current User    Types: Snuff  Substance Use Topics  . Alcohol use: No  . Drug use: Yes    Comment: percoset    Home Medications Prior to Admission medications   Medication Sig Start Date End Date Taking? Authorizing Provider  naloxone [facility]) nasal spray 4 mg/0.1 mL Use as needed for opiate overdose 04/17/19   Lilia Pro., MD    Allergies    Tramadol  Review of Systems   Review of Systems  Unable to perform ROS: Acuity of condition  Constitutional: Negative for fever.  Cardiovascular: Negative for chest pain.  Gastrointestinal: Negative for abdominal pain.    Physical Exam Updated Vital Signs BP 124/82   Pulse (!) 102   Temp (!) 97.5 F (36.4 C) (Axillary)   Resp (!)  28   Ht 5\' 10"  (1.778 m)   SpO2 100%   BMI 26.54 kg/m   Physical Exam Vitals and nursing note reviewed.  Constitutional:      Appearance: He is not diaphoretic.  HENT:     Head: Normocephalic and atraumatic.     Right Ear: Tympanic membrane normal.     Left Ear: Tympanic membrane normal.     Nose: Nose normal.     Mouth/Throat:     Mouth: Mucous membranes are moist.     Pharynx: Oropharynx is clear.  Eyes:     Comments: Pinpoint pupils B  Cardiovascular:     Rate and Rhythm: Normal rate and regular rhythm.     Pulses: Normal pulses.     Heart sounds: Normal heart sounds.  Pulmonary:     Effort: Pulmonary effort is normal.     Breath sounds: Normal breath sounds.     Comments: Was bradypnea prior to intubation  Abdominal:     General: Abdomen is flat. Bowel sounds are normal.     Tenderness: There is no abdominal tenderness.  Musculoskeletal:        General: No deformity. Normal range of motion.     Cervical back: Normal range of motion and neck supple. No rigidity.  Lymphadenopathy:     Cervical: No cervical adenopathy.  Skin:  General: Skin is warm and dry.     Capillary Refill: Capillary refill takes less than 2 seconds.  Neurological:     GCS: GCS eye subscore is 1. GCS verbal subscore is 1. GCS motor subscore is 1.     Deep Tendon Reflexes: Reflexes normal.  Psychiatric:     Comments: Unable      ED Results / Procedures / Treatments   Labs (all labs ordered are listed, but only abnormal results are displayed) Results for orders placed or performed during the hospital encounter of 07/31/19  CBC WITH DIFFERENTIAL  Result Value Ref Range   WBC 8.9 4.0 - 10.5 K/uL   RBC 4.07 (L) 4.22 - 5.81 MIL/uL   Hemoglobin 12.3 (L) 13.0 - 17.0 g/dL   HCT 27.7 (L) 41.2 - 87.8 %   MCV 92.1 80.0 - 100.0 fL   MCH 30.2 26.0 - 34.0 pg   MCHC 32.8 30.0 - 36.0 g/dL   RDW 67.6 72.0 - 94.7 %   Platelets 224 150 - 400 K/uL   nRBC 0.0 0.0 - 0.2 %   Neutrophils Relative % 76 %     Neutro Abs 6.8 1.7 - 7.7 K/uL   Lymphocytes Relative 12 %   Lymphs Abs 1.1 0.7 - 4.0 K/uL   Monocytes Relative 9 %   Monocytes Absolute 0.8 0.1 - 1.0 K/uL   Eosinophils Relative 1 %   Eosinophils Absolute 0.1 0.0 - 0.5 K/uL   Basophils Relative 1 %   Basophils Absolute 0.1 0.0 - 0.1 K/uL   Immature Granulocytes 1 %   Abs Immature Granulocytes 0.05 0.00 - 0.07 K/uL  CBG monitoring, ED  Result Value Ref Range   Glucose-Capillary 116 (H) 70 - 99 mg/dL  I-STAT 7, (LYTES, BLD GAS, ICA, H+H)  Result Value Ref Range   pH, Arterial 7.394 7.350 - 7.450   pCO2 arterial 51.6 (H) 32.0 - 48.0 mmHg   pO2, Arterial 366 (H) 83.0 - 108.0 mmHg   Bicarbonate 31.6 (H) 20.0 - 28.0 mmol/L   TCO2 33 (H) 22 - 32 mmol/L   O2 Saturation 100.0 %   Acid-Base Excess 6.0 (H) 0.0 - 2.0 mmol/L   Sodium 147 (H) 135 - 145 mmol/L   Potassium 3.2 (L) 3.5 - 5.1 mmol/L   Calcium, Ion 1.23 1.15 - 1.40 mmol/L   HCT 34.0 (L) 39.0 - 52.0 %   Hemoglobin 11.6 (L) 13.0 - 17.0 g/dL   Patient temperature 09.6 F    Collection site Radial    Drawn by RT    Sample type ARTERIAL    CT Head Wo Contrast  Result Date: 07/31/2019 CLINICAL DATA:  Initial evaluation for acute unresponsiveness. EXAM: CT HEAD WITHOUT CONTRAST TECHNIQUE: Contiguous axial images were obtained from the base of the skull through the vertex without intravenous contrast. COMPARISON:  None. FINDINGS: Brain: Cerebral volume within normal limits for patient age. No evidence for acute intracranial hemorrhage. No findings to suggest acute large vessel territory infarct. No mass lesion, midline shift, or mass effect. Ventricles are normal in size without evidence for hydrocephalus. No extra-axial fluid collection identified. Vascular: No hyperdense vessel identified. Skull: Scalp soft tissues demonstrate no acute abnormality. Calvarium intact. Sinuses/Orbits: Globes and orbital soft tissues within normal limits. Visualized paranasal sinuses are clear. No mastoid  effusion. Mild scattered mucosal thickening noted within the ethmoidal air cells. Enteric tube partially visualized. Trace right mastoid effusion. IMPRESSION: Normal head CT.  No acute intracranial abnormality identified. Electronically Signed  By: Rise Mu M.D.   On: 07/31/2019 05:18   DG Chest Port 1 View  Result Date: 07/31/2019 CLINICAL DATA:  Initial evaluation for intubation. EXAM: PORTABLE CHEST 1 VIEW COMPARISON:  Prior radiograph from 07/28/2016. FINDINGS: Endotracheal tube in place with tip positioned 2.4 cm above the carina. Enteric tube courses into the abdomen, nonvisualization of the tip. Transverse heart size within normal limits. Mediastinal silhouette normal. Lungs are hypoinflated. Secondary diffuse bronchovascular crowding. No definite focal infiltrates. No edema or effusion. No pneumothorax. Visualized osseous structures within normal limits. IMPRESSION: 1. Tip of endotracheal tube 2.4 cm above the carina. 2. Shallow lung inflation with secondary diffuse bronchovascular crowding. No other active cardiopulmonary disease identified. Electronically Signed   By: Rise Mu M.D.   On: 07/31/2019 05:27    EKG  EKG Interpretation  Date/Time:  Saturday Jul 31 2019 04:40:45 EDT Ventricular Rate:  91 PR Interval:    QRS Duration: 81 QT Interval:  350 QTC Calculation: 431 R Axis:   48 Text Interpretation: Sinus rhythm Confirmed by Nicanor Alcon, Shaheem Pichon ([id number]) on 07/31/2019 5:09:21 AM       Radiology No results found.  Procedures Procedures (including critical care time)  Medications Ordered in ED Medications  propofol (DIPRIVAN) 1000 MG/100ML infusion (has no administration in time range)  etomidate (AMIDATE) injection (20 mg Intravenous Given 07/31/19 0428)  rocuronium (ZEMURON) injection (80 mg Intravenous Given 07/31/19 0428)    ED Course  I have reviewed the triage vital signs and the nursing notes.  Pertinent labs & imaging results that were available  during my care of the patient were reviewed by me and considered in my medical decision making (see chart for details).    Unresponsive secondary to OD.    MDM Reviewed: previous chart, nursing note and vitals Reviewed previous: labs Interpretation: x-ray, ECG, labs and CT scan (ETT and OGT in good position.  normal CBC.  No bleed on CT head by me ) Total time providing critical care: 30-74 minutes. This excludes time spent performing separately reportable procedures and services. Consults: critical care  CRITICAL CARE Performed by: Nhung Danko K Jams Trickett-Rasch Total critical care time: 60 minutes Critical care time was exclusive of separately billable procedures and treating other patients. Critical care was necessary to treat or prevent imminent or life-threatening deterioration. Critical care was time spent personally by me on the following activities: development of treatment plan with patient and/or surrogate as well as nursing, discussions with consultants, evaluation of patient's response to treatment, examination of patient, obtaining history from patient or surrogate, ordering and performing treatments and interventions, ordering and review of laboratory studies, ordering and review of radiographic studies, pulse oximetry and re-evaluation of patient's condition.  Final Clinical Impression(s) / ED Diagnoses Final diagnoses:  Drug overdose, undetermined intent, subsequent encounter  Respiratory arrest [facility])  Polysubstance abuse (HCC)    Admit to ICU, case d/w Dr. Daylene Katayama, Liberato Stansbery, MD 07/31/19 [phone]

## 2019-07-31 NOTE — ED Provider Notes (Signed)
Patient signed out pending reassessment.  On my reassessment he is on the phone with his ride.  Request he is ready to go.  He is awake, alert.  Vital signs reviewed and reassuring.  Patient discharged per Dr. Mable Paris discharge instructions.   Shon Baton, MD 07/31/19 [phone]

## 2019-07-31 NOTE — ED Notes (Signed)
Pt resting with eyes closed.

## 2019-07-31 NOTE — ED Triage Notes (Signed)
Patient brought in by EMS and [employer] due to disruptive behavior and using drugs. Pt was seen here earlier and d/c'd. Per EMS pt had "drug induced delirium" and was given 7.5 mg of versed. Upon arrival - GCS 8, snoring respirations.

## 2019-07-31 NOTE — H&P (Signed)
NAME:  Marc Marks, MRN:  [id number], DOB:  November 24, 1975, LOS: 0 ADMISSION DATE:  07/31/2019, CONSULTATION DATE:  07/31/19 REFERRING MD:  Nicanor Alcon - EM , CHIEF COMPLAINT:  AMS, airway compromise requiring intubation   Brief History   44 yo brought in 5/21 with agitation/disruptive behavior, dc home. Brought back into ED 5/22 for disruptive behavior after being given 7.5mg  Versed by EMS due to agitation. Arrives to ED with GCS 8, snoring resp. Subsequently intubated.   History of present illness   44 yo M PMH HTN polysubstance abuse  DM, who initially presented to ED via [facility] 5/21 with disruptive behavior. Did not warrant admission at this time and was discharged. Subsequently brought back in to ED via EMS / [facility] for disruptive behavior and "drug induced delirium," for which EMS administered 7.5mg  versed prior to ED presentation. Upon arrival to ED, pt noted to have GCS 8 and snoring respirations and was intubated by EM provider.   Presenting labs significant for: ABG  7.394/51.6/366/33/6/31.6 after being placed on vent Na 147, K 3.2  Acetaminophen < 10 salicylate <7 Glu 124  EtOH < 10 UDS + for opioids cocaine and THC CT H obtained and was without acute intracranial abnormality  Past Medical History  Polysubstance abuse DM HTN  Significant Hospital Events   5/21 initial ED presentation for agitation, no indication for admission and discharged 5/22 presents again following agitation, given 7.5mg  versed by EMS, intubated in ED. Admit to ICU   Consults:    Procedures:  5/22 ETT>  Significant Diagnostic Tests:  5/22 Ct H > no intracranial abnormalities   Micro Data:  5/22 SARS Cov2> neg   Antimicrobials:  na  Interim history/subjective:  Waking up in ED and pulling at ETT. Propofol bolus given   Objective   Blood pressure 118/78, pulse 69, temperature (!) 97.5 F (36.4 C), temperature source Axillary, resp. rate 20, height 5\' 10"  (1.778 m), SpO2 99 %.    Vent Mode: PRVC FiO2  (%):  [100 %] 100 % Set Rate:  [20 bmp] 20 bmp Vt Set:  [580 mL] 580 mL PEEP:  [5 cmH20] 5 cmH20 Plateau Pressure:  [15 cmH20] 15 cmH20  No intake or output data in the 24 hours ending 07/31/19 0721 There were no vitals filed for this visit.  Examination: General: WDWN adult M, intubated and sedated in ED NAD  HENT: NCAT. ETT secure. Pink mmm, poor dentition. Trachea midline. Anicteric sclera Lungs: Symmetrical chest expansion, CTA bilaterally. No accessory muscle use Cardiovascular: RRR s1s2 no rgm. Cap refill < 3 seconds BUE BLE. No BLE edema. 2+ radial pulses bilaterally  Abdomen: soft flat ndnt normoactive x4  Extremities: Symmetrical bulk and tone. No obvious joint deformity. No cyanosis or clubbing  Neuro: Sedated on propofol at time of exam. PERRL 55mm. Moving BUE BLE spontaneously and purposefully. Not following commands GU: WNL  Skin: multiple healed tattoos. C/d/w without rash   Resolved Hospital Problem list     Assessment & Plan:   Acute Encephalopathy -felt by EMS and EM to be drug-related. Then received 7.5mg  versed prior to arrival. -low suspicion for infectious process -CT H unremarkable Polysubstance abuse  P -Correct metabolic abnormalities as able -Will try to discuss with NOK/friends to ID what substance was ingested. UDS + cocaine THC opioids. May need to allow time for metabolism of substance causing agitation then wean sedation.  -Cont propofol. Pending hemodynamics, may switch to precedex. PRN fent  -TOC consult for polysubstance abuse  Acute respiratory failure in setting of compromised airway due to CNS depressing medications  -low suspicion for underlying pulm process  P -continue MV, adjust PEEP FiO2 PRN -WUA/SBT later today, hope to extubate 5/22 vs 5/23  -VAP, PAD, pulm hygiene  Hx HTN -unknown home reg  P -cardiac monitoring -no antihypertensives indicated at present  Normocytic anemia P -trend CBC   Hyperbilirubinemia, mild -LFTs  WNL. No splenomegaly  P -trend  Hypokalemia, mild Hypernatremia, mild P -replace K -gently mIVF   Hx DM -unknown home med reg/ compliance P -SSI  -check a1c    Best practice:  Diet: NPO Pain/Anxiety/Delirium protocol (if indicated): prop, fent  VAP protocol (if indicated): yes DVT prophylaxis: lovenox GI prophylaxis: PPI Glucose control: SSI Mobility: BR Code Status: Full  Family Communication: pending Disposition: ICU   Labs   CBC: Recent Labs  Lab 07/30/19 1950 07/31/19 0440 07/31/19 0506  WBC 12.8* 8.9  --   NEUTROABS 9.4* 6.8  --   HGB 13.8 12.3* 11.6*  HCT 42.3 37.5* 34.0*  MCV 91.0 92.1  --   PLT 273 224  --     Basic Metabolic Panel: Recent Labs  Lab 07/30/19 1950 07/31/19 0440 07/31/19 0506  NA 150* 149* 147*  K 3.4* 3.2* 3.2*  CL 108 111  --   CO2 28 25  --   GLUCOSE 181* 124*  --   BUN 9 11  --   CREATININE 1.37* 1.21  --   CALCIUM 9.2 8.5*  --    GFR: Estimated Creatinine Clearance: 82.1 mL/min (by C-G formula based on SCr of 1.21 mg/dL). Recent Labs  Lab 07/30/19 1950 07/31/19 0440  WBC 12.8* 8.9    Liver Function Tests: Recent Labs  Lab 07/30/19 1950 07/31/19 0440  AST 25 33  ALT 19 18  ALKPHOS 56 50  BILITOT 1.9* 1.8*  PROT 7.1 6.3*  ALBUMIN 4.1 3.6   No results for input(s): LIPASE, AMYLASE in the last 168 hours. No results for input(s): AMMONIA in the last 168 hours.  ABG    Component Value Date/Time   PHART 7.394 07/31/2019 0506   PCO2ART 51.6 (H) 07/31/2019 0506   PO2ART 366 (H) 07/31/2019 0506   HCO3 31.6 (H) 07/31/2019 0506   TCO2 33 (H) 07/31/2019 0506   O2SAT 100.0 07/31/2019 0506     Coagulation Profile: No results for input(s): INR, PROTIME in the last 168 hours.  Cardiac Enzymes: No results for input(s): CKTOTAL, CKMB, CKMBINDEX, TROPONINI in the last 168 hours.  HbA1C: No results found for: HGBA1C  CBG: Recent Labs  Lab 07/31/19 0457  GLUCAP 116*    Review of Systems:   Unable to  obtain, intubated sedated   Past Medical History  He,  has a past medical history of Diabetes mellitus without complication (HCC), Hypertension, and Polysubstance abuse (HCC).   Surgical History   No past surgical history on file.   Social History   reports that he has never smoked. His smokeless tobacco use includes snuff. He reports current drug use. He reports that he does not drink alcohol.   Family History   His family history is not on file.   Allergies Allergies  Allergen Reactions  . Tramadol Nausea And Vomiting and Other (See Comments)    Dizziness     Home Medications  Prior to Admission medications   Medication Sig Start Date End Date Taking? Authorizing Provider  naloxone [facility]) nasal spray 4 mg/0.1 mL Use as needed for opiate  overdose 04/17/19   Lilia Pro., MD     Critical care time: 23 min     CRITICAL CARE Performed by: Cristal Generous   Total critical care time: 45 minutes  Critical care time was exclusive of separately billable procedures and treating other patients.  Critical care was necessary to treat or prevent imminent or life-threatening deterioration.  Critical care was time spent personally by me on the following activities: development of treatment plan with patient and/or surrogate as well as nursing, discussions with consultants, evaluation of patient's response to treatment, examination of patient, obtaining history from patient or surrogate, ordering and performing treatments and interventions, ordering and review of laboratory studies, ordering and review of radiographic studies, pulse oximetry and re-evaluation of patient's condition.  Eliseo Gum MSN, AGACNP-BC [place] [phone] If no answer, [phone] 07/31/2019, 8:24 AM

## 2019-07-31 NOTE — ED Provider Notes (Signed)
  [facility] EMERGENCY DEPARTMENT Provider Note      Procedures Procedure Name: Intubation Date/Time: 07/31/2019 4:39 AM Performed by: Roxy Horseman, PA-C Pre-anesthesia Checklist: Patient identified, Patient being monitored, Emergency Drugs available, Timeout performed and Suction available Oxygen Delivery Method: Non-rebreather mask Preoxygenation: Pre-oxygenation with 100% oxygen Induction Type: Rapid sequence Ventilation: Mask ventilation without difficulty Laryngoscope Size: Glidescope and 3 Tube size: 7.5 mm Number of attempts: 1 Airway Equipment and Method: Video-laryngoscopy Placement Confirmation: ETT inserted through vocal cords under direct vision,  CO2 detector and Breath sounds checked- equal and bilateral Secured at: 23 cm Tube secured with: ETT holder Dental Injury: Teeth and Oropharynx as per pre-operative assessment  Difficulty Due To: Difficult Airway- due to anterior larynx          Roxy Horseman, PA-C 07/31/19 0441    Palumbo, April, MD 07/31/19 5301

## 2019-07-31 NOTE — ED Notes (Signed)
Pt awake and talking on phone.

## 2019-08-01 LAB — BASIC METABOLIC PANEL
Anion gap: 11 (ref 5–15)
BUN: 15 mg/dL (ref 6–20)
CO2: 25 mmol/L (ref 22–32)
Calcium: 8.8 mg/dL — ABNORMAL LOW (ref 8.9–10.3)
Chloride: 111 mmol/L (ref 98–111)
Creatinine, Ser: 1.08 mg/dL (ref 0.61–1.24)
GFR calc Af Amer: >60 mL/min (ref >60)
GFR calc non Af Amer: >60 mL/min (ref >60)
Glucose, Bld: 86 mg/dL (ref 70–99)
Potassium: 3 mmol/L — ABNORMAL LOW (ref 3.5–5.1)
Sodium: 147 mmol/L — ABNORMAL HIGH (ref 135–145)

## 2019-08-01 LAB — CBC
HCT: 37.8 % — ABNORMAL LOW (ref 39.0–52.0)
Hemoglobin: 12.5 g/dL — ABNORMAL LOW (ref 13.0–17.0)
MCH: 29.9 pg (ref 26.0–34.0)
MCHC: 33.1 g/dL (ref 30.0–36.0)
MCV: 90.4 fL (ref 80.0–100.0)
Platelets: 225 10*3/uL (ref 150–400)
RBC: 4.18 MIL/uL — ABNORMAL LOW (ref 4.22–5.81)
RDW: 15.6 % — ABNORMAL HIGH (ref 11.5–15.5)
WBC: 7.7 10*3/uL (ref 4.0–10.5)
nRBC: 0 % (ref 0.0–0.2)

## 2019-08-01 LAB — GLUCOSE, CAPILLARY
Glucose-Capillary: 132 mg/dL — ABNORMAL HIGH (ref 70–99)
Glucose-Capillary: 79 mg/dL (ref 70–99)
Glucose-Capillary: 79 mg/dL (ref 70–99)
Glucose-Capillary: 80 mg/dL (ref 70–99)
Glucose-Capillary: 81 mg/dL (ref 70–99)
Glucose-Capillary: 81 mg/dL (ref 70–99)

## 2019-08-01 LAB — MAGNESIUM: Magnesium: 2.1 mg/dL (ref 1.7–2.4)

## 2019-08-01 LAB — PHOSPHORUS: Phosphorus: 4.1 mg/dL (ref 2.5–4.6)

## 2019-08-01 MED ORDER — POTASSIUM CHLORIDE 10 MEQ/100ML IV SOLN
10.0000 meq | INTRAVENOUS | Status: AC
  Administered 2019-08-01 (×6): 10 meq via INTRAVENOUS
  Filled 2019-08-01 (×6): qty 100

## 2019-08-01 MED ORDER — POTASSIUM CHLORIDE 10 MEQ/50ML IV SOLN: 10.0000 meq | INTRAVENOUS | Status: DC

## 2019-08-01 MED ORDER — DOCUSATE SODIUM 100 MG PO CAPS
100.0000 mg | ORAL_CAPSULE | Freq: Two times a day (BID) | ORAL | Status: DC
  Administered 2019-08-01 – 2019-08-02 (×2): 100 mg via ORAL
  Filled 2019-08-01 (×2): qty 1

## 2019-08-01 NOTE — Procedures (Signed)
Extubation Procedure Note  Patient Details:   Name: Marc Marks DOB: 1975-11-22 MRN: [id number]   Airway Documentation:    Vent end date: 08/01/19 Vent end time: 1228   Evaluation  O2 sats: stable throughout Complications: No apparent complications Patient did tolerate procedure well. Bilateral Breath Sounds: Clear, Diminished   Yes   Pt extubated to 2L [place] per MD order. RN and RT at bedside, cuff leak heard prior to extubation. No apparent complications noted. Pt is alert and able to clear secretions at this time. RT will continue to monitor.  Dewain Penning T 08/01/2019, 12:30 PM

## 2019-08-01 NOTE — Progress Notes (Signed)
eLink Physician-Brief Progress Note Patient Name: Marc Marks DOB: 26-Dec-1975 MRN: [id number]   Date of Service  08/01/2019  HPI/Events of Note  K+ = 3.0 and Creatinine = 1.08.   eICU Interventions  Will replace K+.     Intervention Category Major Interventions: Electrolyte abnormality - evaluation and management  Ambri Miltner [place] 08/01/2019, 5:24 AM

## 2019-08-01 NOTE — Progress Notes (Signed)
NAME:  Marc Marks, MRN:  [id number], DOB:  12-Jul-1975, LOS: 1 ADMISSION DATE:  07/31/2019, CONSULTATION DATE:  07/31/19 REFERRING MD:  Nicanor Alcon - EM , CHIEF COMPLAINT:  AMS, airway compromise requiring intubation   Brief History   44 yo brought in 5/21 with agitation/disruptive behavior, dc home. Brought back into ED 5/22 for disruptive behavior after being given 7.5mg  Versed by EMS due to agitation. Arrives to ED with GCS 8, snoring resp. Subsequently intubated.   History of present illness   44 yo M PMH HTN polysubstance abuse  DM, who initially presented to ED via [facility] 5/21 with disruptive behavior. Did not warrant admission at this time and was discharged. Subsequently brought back in to ED via EMS / [facility] for disruptive behavior and "drug induced delirium," for which EMS administered 7.5mg  versed prior to ED presentation. Upon arrival to ED, pt noted to have GCS 8 and snoring respirations and was intubated by EM provider.   Presenting labs significant for: ABG  7.394/51.6/366/33/6/31.6 after being placed on vent Na 147, K 3.2  Acetaminophen < 10 salicylate <7 Glu 124  EtOH < 10 UDS + for opioids cocaine and THC CT H obtained and was without acute intracranial abnormality  Past Medical History  Polysubstance abuse DM HTN  Significant Hospital Events   5/21 initial ED presentation for agitation, no indication for admission and discharged 5/22 presents again following agitation, given 7.5mg  versed by EMS, intubated in ED. Admit to ICU   Consults:    Procedures:  5/22 ETT>  Significant Diagnostic Tests:  5/22 Ct H > no intracranial abnormalities   Micro Data:  5/22 SARS Cov2> neg   Antimicrobials:  na  Interim history/subjective:  Waking up in ED and pulling at ETT. Propofol bolus given   Objective   Blood pressure 110/67, pulse (!) 59, temperature 98.7 F (37.1 C), temperature source Axillary, resp. rate 17, height 5\' 10"  (1.778 m), weight 84.2 kg, SpO2 96 %.    Vent  Mode: PSV;CPAP FiO2 (%):  [30 %-40 %] 30 % Set Rate:  [20 bmp] 20 bmp Vt Set:  [580 mL] 580 mL PEEP:  [5 cmH20] 5 cmH20 Pressure Support:  [5 cmH20] 5 cmH20 Plateau Pressure:  [11 cmH20-15 cmH20] 11 cmH20   Intake/Output Summary (Last 24 hours) at 08/01/2019 1114 Last data filed at 08/01/2019 1000 Gross per 24 hour  Intake 1264.41 ml  Output 635 ml  Net 629.41 ml   Filed Weights   07/31/19 1112 08/01/19 0306  Weight: 83.5 kg 84.2 kg    Examination: General: Ill-appearing man, ventilated and sedated HENT: ET tube in good position, poor dentition, no secretions Lungs: Clear bilaterally, no wheezing or crackles Cardiovascular: Regular, distant, no murmur Abdomen: Soft, nondistended with positive bowel sounds Extremities: Warm, no cyanosis, no edema Neuro: He wakes to voice, nods to questions on propofol 35 Skin: No rashes  Resolved Hospital Problem list     Assessment & Plan:   Acute Encephalopathy -felt by EMS and EM to be drug-related. Then received 7.5mg  versed prior to arrival. -low suspicion for infectious process -CT H unremarkable Polysubstance abuse  P Correcting all metabolic abnormalities, improved UDS was positive benzo, cocaine, opiates, THC.  Will need counseling and assistance for polysubstance abuse once extubated and stable Wean propofol to off at the time of extubation Watch closely for any evidence of withdrawal  Acute respiratory failure in setting of compromised airway due to CNS depressing medications  -low suspicion for underlying pulm process  P Tolerating SBT currently.  No barriers to extubation.  Plan to decrease sedation and extubate today 5/23 Push pulmonary hygiene  Hx HTN -unknown home reg  P Follow blood pressure once extubated.  May need to start maintenance therapy  Normocytic anemia P Follow CBC  Hyperbilirubinemia, mild -LFTs WNL. No splenomegaly  P Follow clinically  Hypokalemia, mild Hypernatremia, mild P Potassium  replacement Gentle IV fluids until he is taking adequate enteral  Hx DM -unknown home med reg/ compliance -Hemoglobin A1c 6.8 P Continue sliding-scale insulin as ordered   Best practice:  Diet: NPO, diet once extubated Pain/Anxiety/Delirium protocol (if indicated): Sedation to off 5/23 VAP protocol (if indicated): yes DVT prophylaxis: lovenox GI prophylaxis: PPI Glucose control: SSI Mobility: BR Code Status: Full  Family Communication:  Disposition: ICU   Labs   CBC: Recent Labs  Lab 07/30/19 1950 07/31/19 0440 07/31/19 0506 07/31/19 0913 08/01/19 0350  WBC 12.8* 8.9  --  8.5 7.7  NEUTROABS 9.4* 6.8  --   --   --   HGB 13.8 12.3* 11.6* 12.0* 12.5*  HCT 42.3 37.5* 34.0* 36.8* 37.8*  MCV 91.0 92.1  --  90.2 90.4  PLT 273 224  --  216 225    Basic Metabolic Panel: Recent Labs  Lab 07/30/19 1950 07/31/19 0440 07/31/19 0506 07/31/19 0913 08/01/19 0350  NA 150* 149* 147*  --  147*  K 3.4* 3.2* 3.2*  --  3.0*  CL 108 111  --   --  111  CO2 28 25  --   --  25  GLUCOSE 181* 124*  --   --  86  BUN 9 11  --   --  15  CREATININE 1.37* 1.21  --  1.07 1.08  CALCIUM 9.2 8.5*  --   --  8.8*  MG  --   --   --   --  2.1  PHOS  --   --   --   --  4.1   GFR: Estimated Creatinine Clearance: 92 mL/min (by C-G formula based on SCr of 1.08 mg/dL). Recent Labs  Lab 07/30/19 1950 07/31/19 0440 07/31/19 0913 08/01/19 0350  WBC 12.8* 8.9 8.5 7.7    Liver Function Tests: Recent Labs  Lab 07/30/19 1950 07/31/19 0440  AST 25 33  ALT 19 18  ALKPHOS 56 50  BILITOT 1.9* 1.8*  PROT 7.1 6.3*  ALBUMIN 4.1 3.6   No results for input(s): LIPASE, AMYLASE in the last 168 hours. No results for input(s): AMMONIA in the last 168 hours.  ABG    Component Value Date/Time   PHART 7.394 07/31/2019 0506   PCO2ART 51.6 (H) 07/31/2019 0506   PO2ART 366 (H) 07/31/2019 0506   HCO3 31.6 (H) 07/31/2019 0506   TCO2 33 (H) 07/31/2019 0506   O2SAT 100.0 07/31/2019 0506      Coagulation Profile: No results for input(s): INR, PROTIME in the last 168 hours.  Cardiac Enzymes: No results for input(s): CKTOTAL, CKMB, CKMBINDEX, TROPONINI in the last 168 hours.  HbA1C: Hgb A1c MFr Bld  Date/Time Value Ref Range Status  07/31/2019 09:13 AM 6.8 (H) 4.8 - 5.6 % Final    Comment:    (NOTE) Pre diabetes:          5.7%-6.4% Diabetes:              >6.4% Glycemic control for   <7.0% adults with diabetes     CBG: Recent Labs  Lab 07/31/19 1540 07/31/19 1959  08/01/19 0006 08/01/19 0441 08/01/19 0745  GLUCAP 81 83 80 81 81     Critical care time: 32 min     CRITICAL CARE Performed by: Collene Gobble   Total critical care time: 32 minutes  Critical care time was exclusive of separately billable procedures and treating other patients.  Critical care was necessary to treat or prevent imminent or life-threatening deterioration.  Critical care was time spent personally by me on the following activities: development of treatment plan with patient and/or surrogate as well as nursing, discussions with consultants, evaluation of patient's response to treatment, examination of patient, obtaining history from patient or surrogate, ordering and performing treatments and interventions, ordering and review of laboratory studies, ordering and review of radiographic studies, pulse oximetry and re-evaluation of patient's condition.  Baltazar Apo, MD, PhD 08/01/2019, 11:15 AM [facility] [phone] or if no answer [phone]

## 2019-08-02 DIAGNOSIS — T50901A Poisoning by unspecified drugs, medicaments and biological substances, accidental (unintentional), initial encounter: Secondary | ICD-10-CM

## 2019-08-02 LAB — BASIC METABOLIC PANEL
Anion gap: 12 (ref 5–15)
BUN: 9 mg/dL (ref 6–20)
CO2: 24 mmol/L (ref 22–32)
Calcium: 9 mg/dL (ref 8.9–10.3)
Chloride: 108 mmol/L (ref 98–111)
Creatinine, Ser: 0.87 mg/dL (ref 0.61–1.24)
GFR calc Af Amer: >60 mL/min (ref >60)
GFR calc non Af Amer: >60 mL/min (ref >60)
Glucose, Bld: 152 mg/dL — ABNORMAL HIGH (ref 70–99)
Potassium: 3.6 mmol/L (ref 3.5–5.1)
Sodium: 144 mmol/L (ref 135–145)

## 2019-08-02 LAB — GLUCOSE, CAPILLARY
Glucose-Capillary: 142 mg/dL — ABNORMAL HIGH (ref 70–99)
Glucose-Capillary: 148 mg/dL — ABNORMAL HIGH (ref 70–99)
Glucose-Capillary: 131 mg/dL — ABNORMAL HIGH (ref 70–99)
Glucose-Capillary: 152 mg/dL — ABNORMAL HIGH (ref 70–99)

## 2019-08-02 LAB — MAGNESIUM: Magnesium: 2.1 mg/dL (ref 1.7–2.4)

## 2019-08-02 MED ORDER — MELATONIN 3 MG PO TABS: 3.0000 mg | ORAL_TABLET | Freq: Once | ORAL | Status: DC

## 2019-08-02 MED ORDER — LISINOPRIL 10 MG PO TABS: 5.0000 mg | ORAL_TABLET | Freq: Every day | ORAL | Status: DC

## 2019-08-02 MED ORDER — HYDRALAZINE HCL 20 MG/ML IJ SOLN: 10.0000 mg | INTRAMUSCULAR | Status: DC | PRN

## 2019-08-02 MED ORDER — CHLORHEXIDINE GLUCONATE 0.12 % MT SOLN
OROMUCOSAL | Status: AC
  Administered 2019-08-02: 15 mL via OROMUCOSAL
  Filled 2019-08-02: qty 15

## 2019-08-02 MED ORDER — INSULIN ASPART 100 UNIT/ML ~~LOC~~ SOLN
0.0000 [IU] | Freq: Three times a day (TID) | SUBCUTANEOUS | Status: DC
  Administered 2019-08-02: 2 [IU] via SUBCUTANEOUS

## 2019-08-02 MED ORDER — MELATONIN 5 MG PO TABS
5.0000 mg | ORAL_TABLET | Freq: Once | ORAL | Status: AC
  Administered 2019-08-02: 5 mg via ORAL
  Filled 2019-08-02: qty 1

## 2019-08-02 NOTE — Discharge Summary (Signed)
Physician Discharge Summary  Patient ID: Tige Meas MRN: [id number] DOB/AGE: 1975/07/22 44 y.o.  Admit date: 07/31/2019 Discharge date: 08/02/2019  Admission Diagnoses:  Discharge Diagnoses:  Active Problems:   Acute respiratory failure (HCC)   Drug overdose   Polysubstance abuse [facility])   Discharged Condition: good  Hospital Course:  44 yo M PMH HTN polysubstance abuse  DM, who initially presented to ED via [facility] 5/21 with disruptive behavior. Did not warrant admission at this time and was discharged. Subsequently brought back in to ED via EMS / [facility] for disruptive behavior and "drug induced delirium," for which EMS administered 7.27m versed prior to ED presentation. In ED, pt noted to have GCS 8 and snoring respirations and was intubated by EM provider. He was extubated on 5/23.  Stable hemodynamics overnight 5/23-5/24. He is alert and oriented with no complaints of pain or shortness of breath.  He had mild hypertension and hgb A1c 6.8.  He will need to establish care with PCP and follow up, CCM to assist prior to discharge.    Consults: pulmonary/intensive care  Significant Diagnostic Studies:  5/22 Ct H > no intracranial abnormalities   Treatments: IV hydration, insulin: Humalog and respiratory therapy: O2  Discharge Exam: Blood pressure (!) 145/93, pulse 67, temperature 98.6 F (37 C), temperature source Oral, resp. rate 17, height _0  (1.778 m), weight 84.2 kg, SpO2 95 %. General appearance: alert and cooperative Head: Normocephalic, without obvious abnormality, atraumatic Resp: clear to auscultation bilaterally Cardio: regular rate and rhythm, S1, S2 normal, no murmur, click, rub or gallop GI: soft, non-tender; bowel sounds normal; no masses,  no organomegaly Extremities: extremities normal, atraumatic, no cyanosis or edema Pulses: 2+ and symmetric Neurologic: Alert and oriented X 3, normal strength and tone. Normal symmetric reflexes. Normal coordination and  gait  Disposition: Discharge disposition: 01-Home or Self Care        Allergies as of 08/02/2019      Reactions   Tramadol Nausea And Vomiting, Other (See Comments)   Dizziness      Medication List    TAKE these medications   acetaminophen 500 MG tablet Commonly known as: TYLENOL Take 1,000 mg by mouth every 6 (six) hours as needed for mild pain or moderate pain.   cetirizine 10 MG tablet Commonly known as: ZYRTEC Take 10 mg by mouth daily.   naloxone 4 MG/0.1ML Liqd nasal spray kit Commonly known as: NARCAN Use as needed for opiate overdose What changed:   how much to take  how to take this  when to take this  additional instructions   naproxen sodium 220 MG tablet Commonly known as: ALEVE Take 220 mg by mouth.        Signed: SMinda Ditto5/24/2021, 11:44 AM

## 2019-08-02 NOTE — Progress Notes (Addendum)
CSW met with patient at bedside to complete discussion regarding his need for a PCP and substance use. Patient admitted to snorting heroin on Saturday night but denies other substance use. CSW explained to patient that his urine was positive for opiates, benzodiazepines, marijauna, and cocaine. Patient asked "what's a benzo?" and states he doesn't do anything other than heroin.   Patient reports he lives with his mother in [place]. Patient states he has a job at [employer]. Patient reports he does not have transportation that he relies on his mother. Patient is single and is the father of one daughter, age 41 who resides with her mother.   Patient states he would be unable to go to a PCP in [facility] and stated very little interest in attending an appointment in [place].  CSW left an abundant amount of substance abuse resources for the patient to take home. Patient states his mother is picking him up at 5pm today. Patient denies any further questions or needs at this time. CSW strongly encouraged patient to cease his drug use.   Madilyn Fireman, MSW, LCSW-A Transitions of Care  Clinical Social Worker  [facility] Emergency Departments  Medical ICU [phone]

## 2019-08-02 NOTE — Evaluation (Signed)
Occupational Therapy Evaluation and Discharge Patient Details Name: Marc Marks MRN: [id number] DOB: 09/18/1975 Today's Date: 08/02/2019    History of Present Illness Pt is a 44 year old man brought to ED by [employer] with encephalopathy and agitated delirium 07/30/19. Intubated for airway protection, extubated 08/01/19. PMH: polysubstance abuse, DM, HTN.   Clinical Impression   Pt is functioning modified independently to independently. No OT needs.    Follow Up Recommendations  No OT follow up    Equipment Recommendations  None recommended by OT    Recommendations for Other Services       Precautions / Restrictions Restrictions Weight Bearing Restrictions: No      Mobility Bed Mobility Overal bed mobility: Independent             General bed mobility comments: HOB flat  Transfers Overall transfer level: Modified independent Equipment used: None                  Balance Overall balance assessment: Mild deficits observed, not formally tested                                         ADL either performed or assessed with clinical judgement   ADL Overall ADL's : Modified independent                                       General ADL Comments: pt slightly unsteady on feet     Vision Baseline Vision/History: No visual deficits       Perception     Praxis      Pertinent Vitals/Pain Pain Assessment: No/denies pain     Hand Dominance Right   Extremity/Trunk Assessment Upper Extremity Assessment Upper Extremity Assessment: Overall WFL for tasks assessed   Lower Extremity Assessment Lower Extremity Assessment: Defer to PT evaluation   Cervical / Trunk Assessment Cervical / Trunk Assessment: Normal   Communication Communication Communication: No difficulties   Cognition Arousal/Alertness: Awake/alert Behavior During Therapy: WFL for tasks assessed/performed Overall Cognitive Status: Within Functional Limits for  tasks assessed                                     General Comments       Exercises     Shoulder Instructions      Home Living Family/patient expects to be discharged to:: Private residence Living Arrangements: Parent Available Help at Discharge: Family;Available PRN/intermittently Type of Home: House Home Access: Level entry     Home Layout: One level     Bathroom Shower/Tub: Producer, television/film/video: Standard     Home Equipment: None          Prior Functioning/Environment Level of Independence: Independent        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[employer], political party Problem List:        OT Treatment/Interventions:      OT Goals(Current goals can be found in the care plan section) Acute Rehab OT Goals Patient Stated Goal: to go back to work  OT Frequency:     Barriers to D/C:            Co-evaluation  AM-PAC OT "6 Clicks" Daily Activity     Outcome Measure Help from another person eating meals?: None Help from another person taking care of personal grooming?: None Help from another person toileting, which includes using toliet, bedpan, or urinal?: None Help from another person bathing (including washing, rinsing, drying)?: None Help from another person to put on and taking off regular upper body clothing?: None Help from another person to put on and taking off regular lower body clothing?: None 6 Click Score: 24   End of Session    Activity Tolerance: Patient tolerated treatment well Patient left: in bed;with call bell/phone within reach  OT Visit Diagnosis: Other abnormalities of gait and mobility (R26.89)                Time: 9735-3299 OT Time Calculation (min): 18 min Charges:  OT General Charges $OT Visit: 1 Visit OT Evaluation $OT Eval Low Complexity: 1 Low  Nestor Lewandowsky, OTR/L Acute Rehabilitation Services Pager: [phone] Office: [phone]  Marc Marks 08/02/2019, 12:41 PM

## 2019-08-02 NOTE — Progress Notes (Addendum)
NAME:  Marc Marks, MRN:  [id number], DOB:  May 31, 1975, LOS: 2 ADMISSION DATE:  07/31/2019, CONSULTATION DATE:  07/31/19 REFERRING MD:  Nicanor Alcon - EM , CHIEF COMPLAINT:  AMS, airway compromise requiring intubation   Brief History   44 yo brought in 5/21 with agitation/disruptive behavior, dc home. Brought back into ED 5/22 for disruptive behavior after being given 7.5mg  Versed by EMS due to agitation. Arrives to ED with GCS 8, snoring resp. Subsequently intubated.   History of present illness   44 yo M PMH HTN polysubstance abuse  DM, who initially presented to ED via [employer] 5/21 with disruptive behavior. Did not warrant admission at this time and was discharged. Subsequently brought back in to ED via EMS / [employer] for disruptive behavior and "drug induced delirium," for which EMS administered 7.5mg  versed prior to ED presentation. Upon arrival to ED, pt noted to have GCS 8 and snoring respirations and was intubated by EM provider.   Presenting labs significant for: ABG  7.394/51.6/366/33/6/31.6 after being placed on vent Na 147, K 3.2  Acetaminophen < 10 salicylate <7 Glu 124  EtOH < 10 UDS + for opioids cocaine and THC CT H obtained and was without acute intracranial abnormality  Past Medical History  Polysubstance abuse DM HTN  Significant Hospital Events   5/21 initial ED presentation for agitation, no indication for admission and discharged 5/22 presents again following agitation, given 7.5mg  versed by EMS, intubated in ED. Admit to ICU   Consults:    Procedures:  5/22 ETT>  Significant Diagnostic Tests:  5/22 Ct H > no intracranial abnormalities   Micro Data:  5/22 SARS Cov2> neg   Antimicrobials:  na  Interim history/subjective:    Objective   Blood pressure (!) 141/92, pulse 67, temperature 98 F (36.7 C), temperature source Oral, resp. rate 15, height 5\' 10"  (1.778 m), weight 84.2 kg, SpO2 97 %.    FiO2 (%):  [30 %] 30 %   Intake/Output Summary (Last 24 hours) at  08/02/2019 0957 Last data filed at 08/02/2019 0900 Gross per 24 hour  Intake 2695.2 ml  Output 650 ml  Net 2045.2 ml   Filed Weights   07/31/19 1112 08/01/19 0306  Weight: 83.5 kg 84.2 kg    Examination: General: Adult male, resting in bed, alert and oriented HENT: AT/[place] Lungs: Clear bilaterally, symmetric expansion.  Cardiovascular: SR on telemetry, warm and well perfused, no edema Abdomen: soft, nondistended with positive bowel sounds Extremities: no edema Neuro: A&O, no focal deficits.  Skin: No rashes  Resolved Hospital Problem list     Assessment & Plan:   Acute Encephalopathy -felt by EMS and EM to be drug-related. Then received 7.5mg  versed prior to arrival. -low suspicion for infectious process -CT H unremarkable Polysubstance abuse  -UDS + Benzo, cocaine, opiates, THC P -Continue to monitor for signs of w/d.   Acute respiratory failure in setting of compromised airway due to CNS depressing medications  -low suspicion for underlying pulm process  -Extubated 5/23 P -Pulmonary hygiene with mobilization.   Hx HTN -unknown home reg  P -No ECHO in our system -Recommend outpatient follow up  Normocytic anemia P -Stable.   Hyperbilirubinemia, mild (1.8) -LFTs WNL. No splenomegaly  P No acute intervention   Hypokalemia P Electrolyte replacement in progress  Hx DM -unknown home med reg/ compliance -Hemoglobin A1c 6.8 P Change SSI to AC/HS from q 4   Best practice:  Diet: Tolerating Diet  Pain/Anxiety/Delirium protocol (if indicated): Sedation to  off 5/23 VAP protocol (if indicated): yes DVT prophylaxis: lovenox GI prophylaxis: PPI >> discontinue Glucose control: SSI Mobility: BR Code Status: Full  Family Communication: N/A Disposition: Stable to transfer out of ICU.   Labs   CBC: Recent Labs  Lab 07/30/19 1950 07/31/19 0440 07/31/19 0506 07/31/19 0913 08/01/19 0350  WBC 12.8* 8.9  --  8.5 7.7  NEUTROABS 9.4* 6.8  --   --   --     HGB 13.8 12.3* 11.6* 12.0* 12.5*  HCT 42.3 37.5* 34.0* 36.8* 37.8*  MCV 91.0 92.1  --  90.2 90.4  PLT 273 224  --  216 712    Basic Metabolic Panel: Recent Labs  Lab 07/30/19 1950 07/31/19 0440 07/31/19 0506 07/31/19 0913 08/01/19 0350 08/02/19 0316  NA 150* 149* 147*  --  147* 144  K 3.4* 3.2* 3.2*  --  3.0* 3.6  CL 108 111  --   --  111 108  CO2 28 25  --   --  25 24  GLUCOSE 181* 124*  --   --  86 152*  BUN 9 11  --   --  15 9  CREATININE 1.37* 1.21  --  1.07 1.08 0.87  CALCIUM 9.2 8.5*  --   --  8.8* 9.0  MG  --   --   --   --  2.1 2.1  PHOS  --   --   --   --  4.1  --    GFR: Estimated Creatinine Clearance: 114.2 mL/min (by C-G formula based on SCr of 0.87 mg/dL). Recent Labs  Lab 07/30/19 1950 07/31/19 0440 07/31/19 0913 08/01/19 0350  WBC 12.8* 8.9 8.5 7.7    Liver Function Tests: Recent Labs  Lab 07/30/19 1950 07/31/19 0440  AST 25 33  ALT 19 18  ALKPHOS 56 50  BILITOT 1.9* 1.8*  PROT 7.1 6.3*  ALBUMIN 4.1 3.6   No results for input(s): LIPASE, AMYLASE in the last 168 hours. No results for input(s): AMMONIA in the last 168 hours.  ABG    Component Value Date/Time   PHART 7.394 07/31/2019 0506   PCO2ART 51.6 (H) 07/31/2019 0506   PO2ART 366 (H) 07/31/2019 0506   HCO3 31.6 (H) 07/31/2019 0506   TCO2 33 (H) 07/31/2019 0506   O2SAT 100.0 07/31/2019 0506     Coagulation Profile: No results for input(s): INR, PROTIME in the last 168 hours.  Cardiac Enzymes: No results for input(s): CKTOTAL, CKMB, CKMBINDEX, TROPONINI in the last 168 hours.  HbA1C: Hgb A1c MFr Bld  Date/Time Value Ref Range Status  07/31/2019 09:13 AM 6.8 (H) 4.8 - 5.6 % Final    Comment:    (NOTE) Pre diabetes:          5.7%-6.4% Diabetes:              >6.4% Glycemic control for   <7.0% adults with diabetes     CBG: Recent Labs  Lab 08/01/19 1545 08/01/19 2001 08/02/19 0101 08/02/19 0417 08/02/19 0801  GLUCAP 79 132* 142* 148* 131*         Paulita Fujita, ACNP [facility]   After hours pager: [phone]

## 2019-08-02 NOTE — Progress Notes (Signed)
eLink Physician-Brief Progress Note Patient Name: Marc Marks DOB: 1975-05-22 MRN: [id number]   Date of Service  08/02/2019  HPI/Events of Note  Hypertension and insoinia Notes mention he is on some meds but unclear which one and not sure if he takes them or not Not agitated on camera HR in 70s, calm  eICU Interventions  Hypertension - PRN hydralazine Melatonin for insomnia     Intervention Category Major Interventions: Hypertension - evaluation and management Intermediate Interventions: Other:  Oretha Milch 08/02/2019, 1:38 AM

## 2019-08-02 NOTE — Discharge Instructions (Signed)
Accidental Drug Poisoning, Adult Accidental drug poisoning happens when a person accidentally takes too much of a substance, such as a prescription medicine, an over-the-counter medicine, a vitamin, a supplement, or an illegal drug. The effects of drug poisoning can be mild, dangerous, or even deadly. What are the causes? This condition is caused by taking too much of a medicine, illegal drug, or other substance. It often results from:  Lack of knowledge about a substance.  Using more than one substance at the same time.  An error made by the health care provider who prescribed the substance.  An error made by the pharmacist who filled the prescription.  A lapse in memory, such as forgetting that you have already taken a dose of the medicine.  Suddenly using a substance after a long period of not using it. The following substances and medicines are more likely to cause an accidental drug poisoning:  Medicines that treat mental problems (psychotropic medicines).  Pain medicines.  Cocaine.  Heroin.  Multivitamins that contain iron.  Over-the-counter cold and cough medicines. What increases the risk? This condition is more likely to occur in:  Elderly adults. Elderly adults are at risk because they may: ? Be taking many different medicines. ? Have difficulty reading labels. ? Forget when they last took their medicine.  People who use illegal drugs.  People who drink alcohol while using illegal drugs or certain medicines.  People with certain mental health conditions. What are the signs or symptoms? Symptoms of this condition depend on the substance and the amount that was taken. Common symptoms include:  Behavior changes, such as confusion.  Sleepiness.  Weakness.  Slowed breathing.  Nausea and vomiting.  Seizures.  Very large or small eye pupil size. A drug poisoning can cause a very serious condition in which your blood pressure drops to a low level (shock).  Symptoms of shock include:  Cold and clammy skin.  Pale skin.  Blue lips.  Very slow breathing.  Extreme sleepiness.  Severe confusion.  Dizziness or fainting. How is this diagnosed? This condition is diagnosed based on:  Your symptoms. You will be asked about the substances you took and when you took them.  A physical exam. You may also have other tests, including:  Urine tests.  Blood tests.  An electrocardiogram (ECG). How is this treated? This condition may need to be treated right away at the hospital. Treatment may involve:  Getting fluids and electrolytes through an IV.  Having a breathing tube inserted in your airway (endotracheal tube) to help you breathe.  Taking medicines. These may include medicines that: ? Absorb any substance that is in your digestive system. ? Block or reverse the effect of the substance that caused the drug poisoning.  Having your blood filtered through an artificial kidney machine (hemodialysis).  Ongoing counseling and mental health support. This may be provided if you used an illegal drug. Follow these instructions at home: Medicines   Take over-the-counter and prescription medicines only as told by your health care provider.  Before taking a new medicine, ask your health care provider whether the medicine: ? May cause side effects. ? Might react with other medicines.  Keep a list of all the medicines that you take, including over-the-counter medicines, vitamins, supplements, and herbs. Bring this list with you to all of your medical visits. General instructions   Drink enough fluid to keep your urine pale yellow.  If you are working with a counselor or mental health professional, make   sure to follow his or her instructions.  Do not drink alcohol if: ? Your health care provider tells you not to drink. ? You are pregnant, may be pregnant, or are planning to become pregnant.  If you drink alcohol, limit how much you  have: ? 0-1 drink a day for women. ? 0-2 drinks a day for men.  Be aware of how much alcohol is in your drink. In the [place] one drink equals one typical bottle of beer (12 oz), one-half glass of wine (5 oz), or one shot of hard liquor (1 oz).  Keep all follow-up visits as told by your health care provider. This is important. How is this prevented?   Get help if you are struggling with: ? Alcohol or drug use. ? Depression or another mental health problem.  Keep the phone number of your local poison control center near your phone or on your cell phone. The hotline of the American Association of Poison Control Centers is (800) 222-1222.  Store all medicines in safety containers that are out of the reach of children.  Read the drug inserts that come with your medicines.  Create a system for taking your medicine, such as a pillbox, that will help you avoid taking too much of the medicine.  Do not drink alcohol while taking medicines unless your health care provider approves.  Do not use illegal drugs.  Do not take medicines that are not prescribed for you. Contact a health care provider if:  Your symptoms return.  You develop new symptoms or side effects after taking a medicine.  You have questions about possible drug poisoning. Call your local poison control center at (800) 222-1222. Get help right away if:  You think that you or someone else may have taken too much of a substance.  You or someone else is having symptoms of drug poisoning. Summary  Accidental drug poisoning happens when a person accidentally takes too much of a substance, such as a prescription medicine, an over-the-counter medicine, a vitamin, a supplement, or an illegal drug.  The effects of drug poisoning can be mild, dangerous, or even deadly.  This condition is diagnosed based on your symptoms and a physical exam. You will be asked to tell your health care provider which substances you took and when you  took them.  This condition may need to be treated right away at the hospital. This information is not intended to replace advice given to you by your health care provider. Make sure you discuss any questions you have with your health care provider. Document Revised: 02/07/2017 Document Reviewed: 01/27/2017 Elsevier Patient Education  2020 [employer]  

## 2019-08-02 NOTE — Evaluation (Signed)
Physical Therapy Evaluation Patient Details Name: Marc Marks MRN: [id number] DOB: [birth year] Today's Date: 08/02/2019   History of Present Illness  Pt is a 44 year old man brought to ED by [employer] with encephalopathy and agitated delirium 07/30/19. Intubated for airway protection, extubated 08/01/19. PMH: polysubstance abuse, DM, HTN.  Clinical Impression  Pt is close to baseline functioning and should be safe at home with available assist. There are no further acute PT needs.  Will sign off at this time.     Follow Up Recommendations No PT follow up    Equipment Recommendations  None recommended by PT    Recommendations for Other Services       Precautions / Restrictions Restrictions Weight Bearing Restrictions: No      Mobility  Bed Mobility Overal bed mobility: Independent             General bed mobility comments: HOB flat  Transfers Overall transfer level: Modified independent Equipment used: None                Ambulation/Gait Ambulation/Gait assistance: Modified independent (Device/Increase time) Gait Distance (Feet): 200 Feet Assistive device: None Gait Pattern/deviations: Step-through pattern        Stairs            Wheelchair Mobility    Modified Rankin (Stroke Patients Only)       Balance Overall balance assessment: Mild deficits observed, not formally tested                                           Pertinent Vitals/Pain Pain Assessment: No/denies pain    Home Living Family/patient expects to be discharged to:: Private residence Living Arrangements: Parent Available Help at Discharge: Family;Available PRN/intermittently Type of Home: House Home Access: Level entry     Home Layout: One level Home Equipment: None      Prior Function Level of Independence: Independent         Comments: works in Museum/gallery curator Dominance   Dominant Hand: Right    Extremity/Trunk Assessment   Upper  Extremity Assessment Upper Extremity Assessment: Overall WFL for tasks assessed    Lower Extremity Assessment Lower Extremity Assessment: Overall WFL for tasks assessed    Cervical / Trunk Assessment Cervical / Trunk Assessment: Normal  Communication   Communication: No difficulties  Cognition Arousal/Alertness: Awake/alert Behavior During Therapy: WFL for tasks assessed/performed Overall Cognitive Status: Within Functional Limits for tasks assessed                                        General Comments      Exercises     Assessment/Plan    PT Assessment Patent does not need any further PT services  PT Problem List         PT Treatment Interventions      PT Goals (Current goals can be found in the Care Plan section)  Acute Rehab PT Goals Patient Stated Goal: to go back to work PT Goal Formulation: With patient    Frequency     Barriers to discharge        Co-evaluation PT/OT/SLP Co-Evaluation/Treatment: Yes Reason for Co-Treatment: For patient/therapist safety PT goals addressed during session: Mobility/safety with mobility  AM-PAC PT "6 Clicks" Mobility  Outcome Measure Help needed turning from your back to your side while in a flat bed without using bedrails?: None Help needed moving from lying on your back to sitting on the side of a flat bed without using bedrails?: None Help needed moving to and from a bed to a chair (including a wheelchair)?: None Help needed standing up from a chair using your arms (e.g., wheelchair or bedside chair)?: None Help needed to walk in hospital room?: None Help needed climbing 3-5 steps with a railing? : None 6 Click Score: 24    End of Session   Activity Tolerance: Patient tolerated treatment well Patient left: in bed;with call bell/phone within reach Nurse Communication: Mobility status PT Visit Diagnosis: Unsteadiness on feet (R26.81)    Time: 6314-9702 PT Time Calculation (min) (ACUTE  ONLY): 18 min   Charges:   PT Evaluation $PT Eval Low Complexity: 1 Low          08/02/2019  Marc Marks., PT Acute Rehabilitation Services [phone]  (pager) [phone]  (office)  Marc Marks 08/02/2019, 5:24 PM

## 2019-09-17 ENCOUNTER — Inpatient Hospital Stay
Admission: EM | Admit: 2019-09-17 | Discharge: 2019-09-19 | DRG: 917 | Disposition: A | Discharge status: Home / Self Care | Payer: Self-pay | Attending: Internal Medicine | Admitting: Internal Medicine

## 2019-09-17 ENCOUNTER — Emergency Department: Payer: Self-pay

## 2019-09-17 ENCOUNTER — Other Ambulatory Visit: Payer: Self-pay

## 2019-09-17 DIAGNOSIS — T424X1A Poisoning by benzodiazepines, accidental (unintentional), initial encounter: Secondary | ICD-10-CM | POA: Diagnosis present

## 2019-09-17 DIAGNOSIS — T407X1A Poisoning by cannabis (derivatives), accidental (unintentional), initial encounter: Secondary | ICD-10-CM | POA: Diagnosis present

## 2019-09-17 DIAGNOSIS — I248 Other forms of acute ischemic heart disease: Secondary | ICD-10-CM | POA: Diagnosis present

## 2019-09-17 DIAGNOSIS — E119 Type 2 diabetes mellitus without complications: Secondary | ICD-10-CM | POA: Diagnosis present

## 2019-09-17 DIAGNOSIS — M6282 Rhabdomyolysis: Secondary | ICD-10-CM | POA: Diagnosis present

## 2019-09-17 DIAGNOSIS — J9601 Acute respiratory failure with hypoxia: Secondary | ICD-10-CM | POA: Diagnosis present

## 2019-09-17 DIAGNOSIS — J9600 Acute respiratory failure, unspecified whether with hypoxia or hypercapnia: Secondary | ICD-10-CM

## 2019-09-17 DIAGNOSIS — A419 Sepsis, unspecified organism: Secondary | ICD-10-CM

## 2019-09-17 DIAGNOSIS — T405X1A Poisoning by cocaine, accidental (unintentional), initial encounter: Principal | ICD-10-CM | POA: Diagnosis present

## 2019-09-17 DIAGNOSIS — R778 Other specified abnormalities of plasma proteins: Secondary | ICD-10-CM

## 2019-09-17 DIAGNOSIS — F17220 Nicotine dependence, chewing tobacco, uncomplicated: Secondary | ICD-10-CM | POA: Diagnosis present

## 2019-09-17 DIAGNOSIS — N179 Acute kidney failure, unspecified: Secondary | ICD-10-CM | POA: Diagnosis present

## 2019-09-17 DIAGNOSIS — I10 Essential (primary) hypertension: Secondary | ICD-10-CM | POA: Diagnosis present

## 2019-09-17 DIAGNOSIS — G92 Toxic encephalopathy: Secondary | ICD-10-CM | POA: Diagnosis present

## 2019-09-17 DIAGNOSIS — Z20822 Contact with and (suspected) exposure to covid-19: Secondary | ICD-10-CM | POA: Diagnosis present

## 2019-09-17 DIAGNOSIS — E872 Acidosis: Secondary | ICD-10-CM | POA: Diagnosis present

## 2019-09-17 DIAGNOSIS — R6520 Severe sepsis without septic shock: Secondary | ICD-10-CM

## 2019-09-17 DIAGNOSIS — R410 Disorientation, unspecified: Secondary | ICD-10-CM

## 2019-09-17 DIAGNOSIS — R451 Restlessness and agitation: Secondary | ICD-10-CM

## 2019-09-17 LAB — URINE DRUG SCREEN, QUALITATIVE (ARMC ONLY)
Amphetamines, Ur Screen: NOT DETECTED
Barbiturates, Ur Screen: NOT DETECTED
Benzodiazepine, Ur Scrn: POSITIVE — AB
Cannabinoid 50 Ng, Ur ~~LOC~~: POSITIVE — AB
Cocaine Metabolite,Ur ~~LOC~~: POSITIVE — AB
MDMA (Ecstasy)Ur Screen: NOT DETECTED
Methadone Scn, Ur: NOT DETECTED
Opiate, Ur Screen: NOT DETECTED
Phencyclidine (PCP) Ur S: NOT DETECTED
Tricyclic, Ur Screen: NOT DETECTED

## 2019-09-17 LAB — CBC WITH DIFFERENTIAL/PLATELET
Abs Immature Granulocytes: 0.19 10*3/uL — ABNORMAL HIGH (ref 0.00–0.07)
Basophils Absolute: 0.1 10*3/uL (ref 0.0–0.1)
Basophils Relative: 0 %
Eosinophils Absolute: 0 10*3/uL (ref 0.0–0.5)
Eosinophils Relative: 0 %
HCT: 39.1 % (ref 39.0–52.0)
Hemoglobin: 13.6 g/dL (ref 13.0–17.0)
Immature Granulocytes: 1 %
Lymphocytes Relative: 3 %
Lymphs Abs: 0.6 10*3/uL — ABNORMAL LOW (ref 0.7–4.0)
MCH: 30 pg (ref 26.0–34.0)
MCHC: 34.8 g/dL (ref 30.0–36.0)
MCV: 86.3 fL (ref 80.0–100.0)
Monocytes Absolute: 1.8 10*3/uL — ABNORMAL HIGH (ref 0.1–1.0)
Monocytes Relative: 9 %
Neutro Abs: 17.7 10*3/uL — ABNORMAL HIGH (ref 1.7–7.7)
Neutrophils Relative %: 87 %
Platelets: 291 10*3/uL (ref 150–400)
RBC: 4.53 MIL/uL (ref 4.22–5.81)
RDW: 13.7 % (ref 11.5–15.5)
WBC: 20.3 10*3/uL — ABNORMAL HIGH (ref 4.0–10.5)
nRBC: 0 % (ref 0.0–0.2)

## 2019-09-17 LAB — LACTIC ACID, PLASMA: Lactic Acid, Venous: 3.3 mmol/L (ref 0.5–1.9)

## 2019-09-17 LAB — BLOOD GAS, ARTERIAL
Acid-base deficit: 2.5 mmol/L — ABNORMAL HIGH (ref 0.0–2.0)
Bicarbonate: 24.2 mmol/L (ref 20.0–28.0)
FIO2: 1
MECHVT: 550 mL
Mechanical Rate: 18
O2 Saturation: 99.9 %
PEEP: 5 cmH2O
Patient temperature: 38.5
RATE: 18 resp/min
pCO2 arterial: 51 mmHg — ABNORMAL HIGH (ref 32.0–48.0)
pH, Arterial: 7.29 — ABNORMAL LOW (ref 7.350–7.450)
pO2, Arterial: 340 mmHg — ABNORMAL HIGH (ref 83.0–108.0)

## 2019-09-17 LAB — URINALYSIS, COMPLETE (UACMP) WITH MICROSCOPIC
Bilirubin Urine: NEGATIVE
Glucose, UA: 150 mg/dL — AB
Ketones, ur: NEGATIVE mg/dL
Leukocytes,Ua: NEGATIVE
Nitrite: NEGATIVE
Protein, ur: 100 mg/dL — AB
Specific Gravity, Urine: 1.027 (ref 1.005–1.030)
WBC, UA: NONE SEEN WBC/hpf (ref 0–5)
pH: 5 (ref 5.0–8.0)

## 2019-09-17 LAB — COMPREHENSIVE METABOLIC PANEL
ALT: 29 U/L (ref 0–44)
AST: 99 U/L — ABNORMAL HIGH (ref 15–41)
Albumin: 4.4 g/dL (ref 3.5–5.0)
Alkaline Phosphatase: 49 U/L (ref 38–126)
Anion gap: 17 — ABNORMAL HIGH (ref 5–15)
BUN: 18 mg/dL (ref 6–20)
CO2: 21 mmol/L — ABNORMAL LOW (ref 22–32)
Calcium: 9.3 mg/dL (ref 8.9–10.3)
Chloride: 101 mmol/L (ref 98–111)
Creatinine, Ser: 1.86 mg/dL — ABNORMAL HIGH (ref 0.61–1.24)
GFR calc Af Amer: 51 mL/min — ABNORMAL LOW (ref >60)
GFR calc non Af Amer: 44 mL/min — ABNORMAL LOW (ref >60)
Glucose, Bld: 160 mg/dL — ABNORMAL HIGH (ref 70–99)
Potassium: 3.7 mmol/L (ref 3.5–5.1)
Sodium: 139 mmol/L (ref 135–145)
Total Bilirubin: 2.2 mg/dL — ABNORMAL HIGH (ref 0.3–1.2)
Total Protein: 7.7 g/dL (ref 6.5–8.1)

## 2019-09-17 LAB — PROCALCITONIN: Procalcitonin: 3.1 ng/mL

## 2019-09-17 LAB — SARS CORONAVIRUS 2 BY RT PCR (HOSPITAL ORDER, PERFORMED IN ~~LOC~~ HOSPITAL LAB): SARS Coronavirus 2: NEGATIVE

## 2019-09-17 LAB — CK: Total CK: 5226 U/L — ABNORMAL HIGH (ref 49–397)

## 2019-09-17 MED ORDER — DOCUSATE SODIUM 100 MG PO CAPS: 100.0000 mg | ORAL_CAPSULE | Freq: Two times a day (BID) | ORAL | Status: DC | PRN

## 2019-09-17 MED ORDER — PROPOFOL 1000 MG/100ML IV EMUL
INTRAVENOUS | Status: AC
  Administered 2019-09-17: 100 mg via INTRAVENOUS
  Filled 2019-09-17: qty 100

## 2019-09-17 MED ORDER — VECURONIUM BROMIDE 10 MG IV SOLR
10.0000 mg | Freq: Once | INTRAVENOUS | Status: AC
  Administered 2019-09-17: 10 mg via INTRAVENOUS

## 2019-09-17 MED ORDER — IOHEXOL 300 MG/ML  SOLN
100.0000 mL | Freq: Once | INTRAMUSCULAR | Status: AC | PRN
  Administered 2019-09-17: 100 mL via INTRAVENOUS

## 2019-09-17 MED ORDER — PANTOPRAZOLE SODIUM 40 MG IV SOLR
40.0000 mg | Freq: Every day | INTRAVENOUS | Status: DC
  Administered 2019-09-18 (×2): 40 mg via INTRAVENOUS
  Filled 2019-09-17 (×2): qty 40

## 2019-09-17 MED ORDER — ETOMIDATE 2 MG/ML IV SOLN: 20.0000 mg | Freq: Once | INTRAVENOUS | Status: DC

## 2019-09-17 MED ORDER — LACTATED RINGERS IV BOLUS (SEPSIS)
1000.0000 mL | Freq: Once | INTRAVENOUS | Status: AC
  Administered 2019-09-17: 1000 mL via INTRAVENOUS

## 2019-09-17 MED ORDER — PROPOFOL 1000 MG/100ML IV EMUL
50.0000 ug/kg/min | INTRAVENOUS | Status: DC
  Administered 2019-09-17: 50 ug/kg/min via INTRAVENOUS

## 2019-09-17 MED ORDER — POLYETHYLENE GLYCOL 3350 17 G PO PACK: 17.0000 g | PACK | Freq: Every day | ORAL | Status: DC | PRN

## 2019-09-17 MED ORDER — SODIUM CHLORIDE 0.9 % IV SOLN
2.0000 g | Freq: Once | INTRAVENOUS | Status: AC
  Administered 2019-09-17: 2 g via INTRAVENOUS
  Filled 2019-09-17: qty 2

## 2019-09-17 MED ORDER — VANCOMYCIN HCL IN DEXTROSE 1-5 GM/200ML-% IV SOLN
1000.0000 mg | Freq: Once | INTRAVENOUS | Status: AC
  Administered 2019-09-17: 1000 mg via INTRAVENOUS
  Filled 2019-09-17: qty 200

## 2019-09-17 MED ORDER — SODIUM CHLORIDE 0.9 % IV SOLN
100.0000 mg | Freq: Once | INTRAVENOUS | Status: AC
  Administered 2019-09-18: 100 mg via INTRAVENOUS
  Filled 2019-09-17: qty 100

## 2019-09-17 MED ORDER — IPRATROPIUM-ALBUTEROL 0.5-2.5 (3) MG/3ML IN SOLN
3.0000 mL | Freq: Four times a day (QID) | RESPIRATORY_TRACT | Status: DC
  Administered 2019-09-18: 3 mL via RESPIRATORY_TRACT
  Filled 2019-09-17 (×2): qty 3

## 2019-09-17 MED ORDER — HEPARIN SODIUM (PORCINE) 5000 UNIT/ML IJ SOLN
5000.0000 [IU] | Freq: Three times a day (TID) | INTRAMUSCULAR | Status: DC
  Administered 2019-09-18: 5000 [IU] via SUBCUTANEOUS
  Filled 2019-09-17: qty 1

## 2019-09-17 MED ORDER — FENTANYL 2500MCG IN NS 250ML (10MCG/ML) PREMIX INFUSION
0.0000 ug/h | INTRAVENOUS | Status: DC
  Administered 2019-09-17: 25 ug/h via INTRAVENOUS
  Filled 2019-09-17: qty 250

## 2019-09-17 MED ORDER — METRONIDAZOLE IN NACL 5-0.79 MG/ML-% IV SOLN
500.0000 mg | Freq: Once | INTRAVENOUS | Status: AC
  Administered 2019-09-17: 500 mg via INTRAVENOUS
  Filled 2019-09-17: qty 100

## 2019-09-17 MED ORDER — PROPOFOL 1000 MG/100ML IV EMUL
5.0000 ug/kg/min | INTRAVENOUS | Status: DC
  Filled 2019-09-17: qty 100

## 2019-09-17 MED ORDER — PROPOFOL 10 MG/ML IV BOLUS: 100.0000 mg | Freq: Once | INTRAVENOUS | Status: AC

## 2019-09-17 NOTE — ED Triage Notes (Addendum)
Pt arrives to ED via [facility] from home with [facility]. Per EMS, pt possibly overdosed on meth or Cocaine with a h/x of same. Pt was combatative on their arrival; was given 4mg  Versed and 5mg  Haldol IM PTA. Pt arrives with snoring respirations, arrives on NRB. Pt is sedated and unable to provide assessment/Triage information. Dr at bedside upon arrival.

## 2019-09-17 NOTE — ED Notes (Signed)
1st set of blood cultures collected by [facility], EDT from right ankle.

## 2019-09-17 NOTE — ED Notes (Signed)
Dr Roxan Hockey made aware at this time of pt's elevated Lactic Acid level as reported by lab. Lactic Acid 3.3 mmol/L

## 2019-09-17 NOTE — ED Provider Notes (Addendum)
[facility] Emergency Department Provider Note    First MD Initiated Contact with Patient 09/17/19 2130     (approximate)  I have reviewed the triage vital signs and the nursing notes.   HISTORY  Chief Complaint Drug Overdose  Level V Caveat:  AMS  HPI Marc Marks is a 44 y.o. male bolus past medical history presents to the ER for evaluation of acute agitation violent behavior and substance abuse.  Patient has a history of the same requiring hospitalization and intubation previously.  Mom states that he gets at work early and when she went to pick him up she could tell that he was under the influence of some substance.  He typically snorts cocaine.  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  Not currently on any antibiotics.  Earlier today was complaining of headache nausea as well and then started becoming increasingly agitated confused and punching himself in the face.  911 was called and the patient became violent and agitated requiring IM Versed as well as Haldol and 3 Sheriff officers to restrain him to be brought to the ER for medical evaluation.    Past Medical History:  Diagnosis Date  . Diabetes mellitus without complication (HCC)   . Hypertension   . Polysubstance abuse (HCC)    No family history on file. History reviewed. No pertinent surgical history. Patient Active Problem List   Diagnosis Date Noted  . Acute respiratory failure (HCC) 07/31/2019  . Drug overdose   . Polysubstance abuse (HCC)       Prior to Admission medications   Medication Sig Start Date End Date Taking? Authorizing Provider  acetaminophen (TYLENOL) 500 MG tablet Take 1,000 mg by mouth every 6 (six) hours as needed for mild pain or moderate pain.    [provider]  cetirizine (ZYRTEC) 10 MG tablet Take 10 mg by mouth daily.    [provider]  naloxone [facility]) nasal spray 4 mg/0.1 mL Use as needed for opiate overdose Patient taking differently:  Place 0.4 mg into the nose See admin instructions. Take 1 spray (0.4 mg totally) as needed for opiate overdose 04/17/19   Miguel Aschoff., MD  naproxen sodium (ALEVE) 220 MG tablet Take 220 mg by mouth.    [provider]    Allergies Tramadol    Social History Social History   Tobacco Use  . Smoking status: Never Smoker  . Smokeless tobacco: Current User    Types: Snuff  Substance Use Topics  . Alcohol use: No  . Drug use: Yes    Comment: percoset    Review of Systems Patient denies headaches, rhinorrhea, blurry vision, numbness, shortness of breath, chest pain, edema, cough, abdominal pain, nausea, vomiting, diarrhea, dysuria, fevers, rashes or hallucinations unless otherwise stated above in HPI. ____________________________________________   PHYSICAL EXAM:  VITAL SIGNS: Vitals:   09/17/19 2336 09/18/19 0004  BP: 95/64 102/67  Pulse: 69 68  Resp: 18 18  Temp:  (!) 96.9 F (36.1 C)  SpO2: 100% 100%    Constitutional: Diaphoretic, critically ill-appearing, snoring respirations intermixed with episodes of thrashing about the bed and swinging at staff members. Eyes: Conjunctivae are normal.  Head: Atraumatic. Nose: No congestion/rhinnorhea. Mouth/Throat: Mucous membranes are dry.   Neck: No stridor. Painless ROM.  Cardiovascular: Normal rate, regular rhythm. Grossly normal heart sounds.  Good peripheral circulation. Respiratory: Snoring respirations Gastrointestinal: Soft and nontender. No distention. No abdominal bruits. No CVA tenderness. Genitourinary: Normal external genitalia Musculoskeletal: No  lower extremity tenderness nor edema.  No joint effusions. Neurologic: Moving all extremities spontaneously  skin:  Skin is diaphoretic and warm. No rash noted. Psychiatric: Unable to assess  ____________________________________________   LABS (all labs ordered are listed, but only abnormal results are displayed)  Results for orders placed or performed  during the hospital encounter of 09/17/19 (from the past 24 hour(s))  Comprehensive metabolic panel     Status: Abnormal   Collection Time: 09/17/19  9:13 PM  Result Value Ref Range   Sodium 139 135 - 145 mmol/L   Potassium 3.7 3.5 - 5.1 mmol/L   Chloride 101 98 - 111 mmol/L   CO2 21 (L) 22 - 32 mmol/L   Glucose, Bld 160 (H) 70 - 99 mg/dL   BUN 18 6 - 20 mg/dL   Creatinine, Ser 1.911.86 (H) 0.61 - 1.24 mg/dL   Calcium 9.3 8.9 - 47.810.3 mg/dL   Total Protein 7.7 6.5 - 8.1 g/dL   Albumin 4.4 3.5 - 5.0 g/dL   AST 99 (H) 15 - 41 U/L   ALT 29 0 - 44 U/L   Alkaline Phosphatase 49 38 - 126 U/L   Total Bilirubin 2.2 (H) 0.3 - 1.2 mg/dL   GFR calc non Af Amer 44 (L) >60 mL/min   GFR calc Af Amer 51 (L) >60 mL/min   Anion gap 17 (H) 5 - 15  CBC WITH DIFFERENTIAL     Status: Abnormal   Collection Time: 09/17/19  9:13 PM  Result Value Ref Range   WBC 20.3 (H) 4.0 - 10.5 K/uL   RBC 4.53 4.22 - 5.81 MIL/uL   Hemoglobin 13.6 13.0 - 17.0 g/dL   HCT 29.539.1 39 - 52 %   MCV 86.3 80.0 - 100.0 fL   MCH 30.0 26.0 - 34.0 pg   MCHC 34.8 30.0 - 36.0 g/dL   RDW 62.113.7 30.811.5 - 65.715.5 %   Platelets 291 150 - 400 K/uL   nRBC 0.0 0.0 - 0.2 %   Neutrophils Relative % 87 %   Neutro Abs 17.7 (H) 1.7 - 7.7 K/uL   Lymphocytes Relative 3 %   Lymphs Abs 0.6 (L) 0.7 - 4.0 K/uL   Monocytes Relative 9 %   Monocytes Absolute 1.8 (H) 0 - 1 K/uL   Eosinophils Relative 0 %   Eosinophils Absolute 0.0 0 - 0 K/uL   Basophils Relative 0 %   Basophils Absolute 0.1 0 - 0 K/uL   Immature Granulocytes 1 %   Abs Immature Granulocytes 0.19 (H) 0.00 - 0.07 K/uL  Procalcitonin     Status: None   Collection Time: 09/17/19  9:13 PM  Result Value Ref Range   Procalcitonin 3.10 ng/mL  CK     Status: Abnormal   Collection Time: 09/17/19  9:13 PM  Result Value Ref Range   Total CK 5,226 (H) 49.0 - 397.0 U/L  Lactic acid, plasma     Status: Abnormal   Collection Time: 09/17/19  9:38 PM  Result Value Ref Range   Lactic Acid, Venous 3.3  (HH) 0.5 - 1.9 mmol/L  Blood gas, arterial     Status: Abnormal   Collection Time: 09/17/19  9:41 PM  Result Value Ref Range   FIO2 1.00    Delivery systems VENTILATOR    Mode ASSIST CONTROL    VT 550 mL   LHR 18 resp/min   Peep/cpap 5.0 cm H20   pH, Arterial 7.29 (L) 7.35 - 7.45   pCO2 arterial  51 (H) 32 - 48 mmHg   pO2, Arterial 340 (H) 83 - 108 mmHg   Bicarbonate 24.2 20.0 - 28.0 mmol/L   Acid-base deficit 2.5 (H) 0.0 - 2.0 mmol/L   O2 Saturation 99.9 %   Patient temperature 38.5    Collection site REVIEWED BY    Sample type ARTERIAL DRAW    Allens test (pass/fail) PASS PASS   Mechanical Rate 18   SARS Coronavirus 2 by RT PCR (hospital order, performed in [facility] hospital lab) Nasopharyngeal Nasopharyngeal Swab     Status: None   Collection Time: 09/17/19 10:00 PM   Specimen: Nasopharyngeal Swab  Result Value Ref Range   SARS Coronavirus 2 NEGATIVE NEGATIVE  Urine Drug Screen, Qualitative ([facility] only)     Status: Abnormal   Collection Time: 09/17/19 10:00 PM  Result Value Ref Range   Tricyclic, Ur Screen NONE DETECTED NONE DETECTED   Amphetamines, Ur Screen NONE DETECTED NONE DETECTED   MDMA (Ecstasy)Ur Screen NONE DETECTED NONE DETECTED   Cocaine Metabolite,Ur [place] POSITIVE (A) NONE DETECTED   Opiate, Ur Screen NONE DETECTED NONE DETECTED   Phencyclidine (PCP) Ur S NONE DETECTED NONE DETECTED   Cannabinoid 50 Ng, Ur [place] POSITIVE (A) NONE DETECTED   Barbiturates, Ur Screen NONE DETECTED NONE DETECTED   Benzodiazepine, Ur Scrn POSITIVE (A) NONE DETECTED   Methadone Scn, Ur NONE DETECTED NONE DETECTED  Urinalysis, Complete w Microscopic     Status: Abnormal   Collection Time: 09/17/19 11:19 PM  Result Value Ref Range   Color, Urine YELLOW (A) YELLOW   APPearance TURBID (A) CLEAR   Specific Gravity, Urine 1.027 1.005 - 1.030   pH 5.0 5.0 - 8.0   Glucose, UA 150 (A) NEGATIVE mg/dL   Hgb urine dipstick LARGE (A) NEGATIVE   Bilirubin Urine NEGATIVE NEGATIVE   Ketones, ur  NEGATIVE NEGATIVE mg/dL   Protein, ur 097 (A) NEGATIVE mg/dL   Nitrite NEGATIVE NEGATIVE   Leukocytes,Ua NEGATIVE NEGATIVE   WBC, UA NONE SEEN 0 - 5 WBC/hpf   Bacteria, UA RARE (A) NONE SEEN   Squamous Epithelial / LPF 0-5 0 - 5   Amorphous Crystal PRESENT    ____________________________________________  EKG My review and personal interpretation at Time: 21:04   Indication: ams  Rate: 115  Rhythm: sinus Axis: normal Other: nonspecific st abn, no stemi ____________________________________________  RADIOLOGY  I personally reviewed all radiographic images ordered to evaluate for the above acute complaints and reviewed radiology reports and findings.  These findings were personally discussed with the patient.  Please see medical record for radiology report.  ____________________________________________   PROCEDURES  Procedure(s) performed:  .Critical Care Performed by: Willy Eddy, MD Authorized by: Willy Eddy, MD   Critical care provider statement:    Critical care time (minutes):  40   Critical care time was exclusive of:  Separately billable procedures and treating other patients   Critical care was necessary to treat or prevent imminent or life-threatening deterioration of the following conditions:  Sepsis, toxidrome and CNS failure or compromise   Critical care was time spent personally by me on the following activities:  Development of treatment plan with patient or surrogate, discussions with consultants, evaluation of patient's response to treatment, examination of patient, obtaining history from patient or surrogate, ordering and performing treatments and interventions, ordering and review of laboratory studies, ordering and review of radiographic studies, pulse oximetry, re-evaluation of patient's condition and review of old charts Procedure Name: Intubation Date/Time: 09/17/2019 11:23 PM  Performed by: Willy Eddy, MD Pre-anesthesia Checklist: Patient  identified, Patient being monitored, Emergency Drugs available, Timeout performed and Suction available Oxygen Delivery Method: Non-rebreather mask Preoxygenation: Pre-oxygenation with 100% oxygen Induction Type: Rapid sequence Ventilation: Mask ventilation without difficulty Laryngoscope Size: Glidescope and 4 Grade View: Grade IV Tube size: 8.0 mm Airway Equipment and Method: Video-laryngoscopy Placement Confirmation: ETT inserted through vocal cords under direct vision,  CO2 detector,  Breath sounds checked- equal and bilateral and Positive ETCO2 Secured at: 24 cm Tube secured with: ETT holder         Critical Care performed: yes ____________________________________________   INITIAL IMPRESSION / ASSESSMENT AND PLAN / ED COURSE  Pertinent labs & imaging results that were available during my care of the patient were reviewed by me and considered in my medical decision making (see chart for details).   DDX: Dehydration, sepsis, pna, uti, hypoglycemia, cva, drug effect, withdrawal, encephalitis   Marc Marks is a 44 y.o. who presents to the ED with acute agitated delirium with snoring respirations requiring intubation for airway protection.  Do suspect substance abuse contributing to his agitation the patient is febrile with significant leukocytosis.  Does have history of multiple admissions for sepsis.  No sign of clear pneumonia with possible aspiration.  Abdominal exam soft.  Mother states that he does not use IV drugs.  Have ordered broad-spectrum antibiotics.  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  Clinical Course as of Sep 17 2355  Fri Sep 17, 2019  2325  Given h/o substance abuse and several hospital encouters for MRSA cellulitis and abscesses with h/o IVDA,  bacteremia also suspected.  However urine does appear grossly purulent will await UA as very well could be source. Meningitis on ddx but mother  reports was not complaining of neckstiffness and states frequently gets headaches with drug use.  No other prodromal sxs or sick contacts to suggest meningitis/encephalitis.  Will defer LP as he has already received abx and unable to obtain LP prior to abx administration given his sepsis, critically illness and agitated delirium.    Case discussed with ICU provider agrees with plan.  [PR]    Clinical Course User Index [PR] Willy Eddy, MD    The patient was evaluated in Emergency Department today for the symptoms described in the history of present illness. He/she was evaluated in the context of the global COVID-19 pandemic, which necessitated consideration that the patient might be at risk for infection with the SARS-CoV-2 virus that causes COVID-19. Institutional protocols and algorithms that pertain to the evaluation of patients at risk for COVID-19 are in a state of rapid change based on information released by regulatory bodies including the CDC and federal and state organizations. These policies and algorithms were followed during the patient's care in the ED.  As part of my medical decision making, I reviewed the following data within the electronic MEDICAL RECORD NUMBER Nursing notes reviewed and incorporated, Labs reviewed, notes from prior ED visits and [place] Controlled Substance Database   ____________________________________________   FINAL CLINICAL IMPRESSION(S) / ED DIAGNOSES  Final diagnoses:  Delirium  Agitation  Sepsis with acute respiratory failure, due to unspecified organism, unspecified whether hypoxia or hypercapnia present, unspecified whether septic shock present (HCC)      NEW MEDICATIONS STARTED DURING THIS VISIT:  New Prescriptions   No medications on file     Note:  This document was prepared using Dragon voice recognition software and  may include unintentional dictation errors.        Willy Eddy, MD 09/18/19 [phone]

## 2019-09-17 NOTE — ED Notes (Signed)
2nd set of blood cultures collected by Jonny Ruiz, EDT from right [facility].

## 2019-09-17 NOTE — Progress Notes (Signed)
Patient transported to CT and back to ED room while on ventilator. No complications.

## 2019-09-17 NOTE — ED Notes (Signed)
5mg  Versed given IVP by RN at 2122pm.

## 2019-09-17 NOTE — Progress Notes (Signed)
PHARMACY -  BRIEF ANTIBIOTIC NOTE   Pharmacy has received consult(s) for Cefepime and Vancomycin from an ED provider.  The patient's profile has been reviewed for ht/wt/allergies/indication/available labs.    One time order(s) placed for Cefepime 2gm and Vancomycin 1gm  Further antibiotics/pharmacy consults should be ordered by admitting physician if indicated.                       Thank you, Wayland Denis 09/17/2019  9:42 PM

## 2019-09-18 ENCOUNTER — Other Ambulatory Visit: Payer: Self-pay

## 2019-09-18 ENCOUNTER — Inpatient Hospital Stay
Admit: 2019-09-18 | Discharge: 2019-09-18 | Disposition: A | Discharge status: Home / Self Care | Payer: Self-pay | Attending: Adult Health | Admitting: Adult Health

## 2019-09-18 DIAGNOSIS — R410 Disorientation, unspecified: Secondary | ICD-10-CM

## 2019-09-18 DIAGNOSIS — R778 Other specified abnormalities of plasma proteins: Secondary | ICD-10-CM

## 2019-09-18 DIAGNOSIS — R6520 Severe sepsis without septic shock: Secondary | ICD-10-CM

## 2019-09-18 DIAGNOSIS — R748 Abnormal levels of other serum enzymes: Secondary | ICD-10-CM

## 2019-09-18 DIAGNOSIS — F1410 Cocaine abuse, uncomplicated: Secondary | ICD-10-CM

## 2019-09-18 LAB — CBC
HCT: 32.2 % — ABNORMAL LOW (ref 39.0–52.0)
Hemoglobin: 11.5 g/dL — ABNORMAL LOW (ref 13.0–17.0)
MCH: 30.3 pg (ref 26.0–34.0)
MCHC: 35.7 g/dL (ref 30.0–36.0)
MCV: 84.7 fL (ref 80.0–100.0)
Platelets: 216 10*3/uL (ref 150–400)
RBC: 3.8 MIL/uL — ABNORMAL LOW (ref 4.22–5.81)
RDW: 13.8 % (ref 11.5–15.5)
WBC: 15.1 10*3/uL — ABNORMAL HIGH (ref 4.0–10.5)
nRBC: 0 % (ref 0.0–0.2)

## 2019-09-18 LAB — BLOOD GAS, ARTERIAL
Acid-base deficit: 1.2 mmol/L (ref 0.0–2.0)
Bicarbonate: 23.6 mmol/L (ref 20.0–28.0)
FIO2: 0.3
MECHVT: 550 mL
O2 Saturation: 98 %
PEEP: 5 cmH2O
Patient temperature: 37
RATE: 18 resp/min
pCO2 arterial: 39 mmHg (ref 32.0–48.0)
pH, Arterial: 7.39 (ref 7.350–7.450)
pO2, Arterial: 105 mmHg (ref 83.0–108.0)

## 2019-09-18 LAB — CK
Total CK: 7185 U/L — ABNORMAL HIGH (ref 49–397)
Total CK: 7784 U/L — ABNORMAL HIGH (ref 49–397)

## 2019-09-18 LAB — COMPREHENSIVE METABOLIC PANEL
ALT: 29 U/L (ref 0–44)
AST: 115 U/L — ABNORMAL HIGH (ref 15–41)
Albumin: 3.6 g/dL (ref 3.5–5.0)
Alkaline Phosphatase: 39 U/L (ref 38–126)
Anion gap: 12 (ref 5–15)
BUN: 18 mg/dL (ref 6–20)
CO2: 22 mmol/L (ref 22–32)
Calcium: 8.2 mg/dL — ABNORMAL LOW (ref 8.9–10.3)
Chloride: 106 mmol/L (ref 98–111)
Creatinine, Ser: 1.35 mg/dL — ABNORMAL HIGH (ref 0.61–1.24)
GFR calc Af Amer: >60 mL/min (ref >60)
GFR calc non Af Amer: >60 mL/min (ref >60)
Glucose, Bld: 124 mg/dL — ABNORMAL HIGH (ref 70–99)
Potassium: 3.6 mmol/L (ref 3.5–5.1)
Sodium: 140 mmol/L (ref 135–145)
Total Bilirubin: 2 mg/dL — ABNORMAL HIGH (ref 0.3–1.2)
Total Protein: 6.1 g/dL — ABNORMAL LOW (ref 6.5–8.1)

## 2019-09-18 LAB — ECHOCARDIOGRAM COMPLETE
Height: 73 in
Weight: 3181.68 oz

## 2019-09-18 LAB — LACTIC ACID, PLASMA
Lactic Acid, Venous: 1.1 mmol/L (ref 0.5–1.9)
Lactic Acid, Venous: 0.9 mmol/L (ref 0.5–1.9)

## 2019-09-18 LAB — GLUCOSE, CAPILLARY: Glucose-Capillary: 124 mg/dL — ABNORMAL HIGH (ref 70–99)

## 2019-09-18 LAB — TROPONIN I (HIGH SENSITIVITY)
Troponin I (High Sensitivity): 876 ng/L (ref <18)
Troponin I (High Sensitivity): 768 ng/L (ref <18)

## 2019-09-18 LAB — PROTIME-INR
INR: 1.1 (ref 0.8–1.2)
Prothrombin Time: 13.6 seconds (ref 11.4–15.2)

## 2019-09-18 LAB — MRSA PCR SCREENING: MRSA by PCR: POSITIVE — AB

## 2019-09-18 LAB — MAGNESIUM: Magnesium: 2.4 mg/dL (ref 1.7–2.4)

## 2019-09-18 LAB — PHOSPHORUS: Phosphorus: 5.2 mg/dL — ABNORMAL HIGH (ref 2.5–4.6)

## 2019-09-18 LAB — HEPARIN LEVEL (UNFRACTIONATED): Heparin Unfractionated: 0.11 IU/mL — ABNORMAL LOW (ref 0.30–0.70)

## 2019-09-18 LAB — PROCALCITONIN: Procalcitonin: 6.9 ng/mL

## 2019-09-18 MED ORDER — PROPOFOL 1000 MG/100ML IV EMUL
5.0000 ug/kg/min | INTRAVENOUS | Status: DC
  Administered 2019-09-18: 50 ug/kg/min via INTRAVENOUS
  Administered 2019-09-18: 40 ug/kg/min via INTRAVENOUS
  Filled 2019-09-18: qty 100

## 2019-09-18 MED ORDER — POLYETHYLENE GLYCOL 3350 17 G PO PACK
17.0000 g | PACK | Freq: Every day | ORAL | Status: DC
  Filled 2019-09-18 (×2): qty 1

## 2019-09-18 MED ORDER — CHLORHEXIDINE GLUCONATE CLOTH 2 % EX PADS
6.0000 | MEDICATED_PAD | Freq: Every day | CUTANEOUS | Status: DC
  Administered 2019-09-19: 6 via TOPICAL

## 2019-09-18 MED ORDER — ORAL CARE MOUTH RINSE
15.0000 mL | OROMUCOSAL | Status: DC
  Administered 2019-09-18 (×5): 15 mL via OROMUCOSAL

## 2019-09-18 MED ORDER — CHLORHEXIDINE GLUCONATE 0.12% ORAL RINSE (MEDLINE KIT)
15.0000 mL | Freq: Two times a day (BID) | OROMUCOSAL | Status: DC
  Administered 2019-09-18 (×2): 15 mL via OROMUCOSAL

## 2019-09-18 MED ORDER — DIPHENHYDRAMINE HCL 50 MG/ML IJ SOLN
50.0000 mg | Freq: Three times a day (TID) | INTRAMUSCULAR | Status: DC
  Administered 2019-09-18 – 2019-09-19 (×4): 50 mg via INTRAVENOUS
  Filled 2019-09-18 (×4): qty 1

## 2019-09-18 MED ORDER — BISACODYL 10 MG RE SUPP: 10.0000 mg | Freq: Every day | RECTAL | Status: DC | PRN

## 2019-09-18 MED ORDER — METRONIDAZOLE IN NACL 5-0.79 MG/ML-% IV SOLN
500.0000 mg | Freq: Three times a day (TID) | INTRAVENOUS | Status: DC
  Administered 2019-09-18 (×2): 500 mg via INTRAVENOUS
  Filled 2019-09-18 (×3): qty 100

## 2019-09-18 MED ORDER — SODIUM CHLORIDE 0.9 % IV SOLN
3.0000 g | Freq: Four times a day (QID) | INTRAVENOUS | Status: DC
  Administered 2019-09-18 – 2019-09-19 (×5): 3 g via INTRAVENOUS
  Filled 2019-09-18: qty 3
  Filled 2019-09-18: qty 8
  Filled 2019-09-18: qty 3
  Filled 2019-09-18: qty 8
  Filled 2019-09-18: qty 3
  Filled 2019-09-18: qty 8
  Filled 2019-09-18 (×2): qty 3

## 2019-09-18 MED ORDER — SODIUM CHLORIDE 0.9 % IV BOLUS
1000.0000 mL | Freq: Once | INTRAVENOUS | Status: AC
  Administered 2019-09-18: 1000 mL via INTRAVENOUS

## 2019-09-18 MED ORDER — HEPARIN (PORCINE) 25000 UT/250ML-% IV SOLN
1200.0000 [IU]/h | INTRAVENOUS | Status: DC
  Administered 2019-09-18: 1200 [IU]/h via INTRAVENOUS
  Filled 2019-09-18: qty 250

## 2019-09-18 MED ORDER — SODIUM CHLORIDE 0.9 % IV SOLN
2.0000 g | Freq: Three times a day (TID) | INTRAVENOUS | Status: DC
  Administered 2019-09-18: 2 g via INTRAVENOUS
  Filled 2019-09-18 (×3): qty 2

## 2019-09-18 MED ORDER — VANCOMYCIN HCL 1750 MG/350ML IV SOLN
1750.0000 mg | INTRAVENOUS | Status: DC
  Filled 2019-09-18: qty 350

## 2019-09-18 MED ORDER — HEPARIN BOLUS VIA INFUSION
2000.0000 [IU] | Freq: Once | INTRAVENOUS | Status: AC
  Administered 2019-09-18: 2000 [IU] via INTRAVENOUS
  Filled 2019-09-18: qty 2000

## 2019-09-18 MED ORDER — MORPHINE SULFATE (PF) 2 MG/ML IV SOLN
2.0000 mg | Freq: Three times a day (TID) | INTRAVENOUS | Status: DC
  Administered 2019-09-18 – 2019-09-19 (×4): 2 mg via INTRAVENOUS
  Filled 2019-09-18 (×4): qty 1

## 2019-09-18 MED ORDER — SODIUM CHLORIDE 0.9 % IV SOLN: INTRAVENOUS | Status: DC

## 2019-09-18 NOTE — Plan of Care (Signed)
  Problem: Education: Goal: Knowledge of General Education information will improve Description: Including pain rating scale, medication(s)/side effects and non-pharmacologic comfort measures 09/18/2019 1024 by Baldemar Lenis, RN Outcome: Progressing 09/18/2019 1022 by Baldemar Lenis, RN Outcome: Progressing   Problem: Health Behavior/Discharge Planning: Goal: Ability to manage health-related needs will improve 09/18/2019 1024 by Baldemar Lenis, RN Outcome: Progressing 09/18/2019 1022 by Baldemar Lenis, RN Outcome: Progressing   Problem: Clinical Measurements: Goal: Ability to maintain clinical measurements within normal limits will improve 09/18/2019 1024 by Baldemar Lenis, RN Outcome: Progressing 09/18/2019 1022 by Baldemar Lenis, RN Outcome: Progressing Goal: Will remain free from infection 09/18/2019 1024 by Baldemar Lenis, RN Outcome: Progressing 09/18/2019 1022 by Baldemar Lenis, RN Outcome: Progressing Goal: Diagnostic test results will improve 09/18/2019 1024 by Baldemar Lenis, RN Outcome: Progressing 09/18/2019 1022 by Baldemar Lenis, RN Outcome: Progressing Goal: Respiratory complications will improve 09/18/2019 1024 by Baldemar Lenis, RN Outcome: Progressing 09/18/2019 1022 by Baldemar Lenis, RN Outcome: Progressing Goal: Cardiovascular complication will be avoided 09/18/2019 1024 by Baldemar Lenis, RN Outcome: Progressing 09/18/2019 1022 by Baldemar Lenis, RN Outcome: Progressing   Problem: Activity: Goal: Risk for activity intolerance will decrease 09/18/2019 1024 by Baldemar Lenis, RN Outcome: Progressing 09/18/2019 1022 by Baldemar Lenis, RN Outcome: Progressing   Problem: Nutrition: Goal: Adequate nutrition will be maintained 09/18/2019 1024 by Baldemar Lenis, RN Outcome: Progressing 09/18/2019 1022 by Baldemar Lenis, RN Outcome: Progressing   Problem: Coping: Goal: Level of anxiety will decrease 09/18/2019  1024 by Baldemar Lenis, RN Outcome: Progressing 09/18/2019 1022 by Baldemar Lenis, RN Outcome: Progressing   Problem: Elimination: Goal: Will not experience complications related to bowel motility 09/18/2019 1024 by Baldemar Lenis, RN Outcome: Progressing 09/18/2019 1022 by Baldemar Lenis, RN Outcome: Progressing Goal: Will not experience complications related to urinary retention 09/18/2019 1024 by Baldemar Lenis, RN Outcome: Progressing 09/18/2019 1022 by Baldemar Lenis, RN Outcome: Progressing   Problem: Pain Managment: Goal: General experience of comfort will improve 09/18/2019 1024 by Baldemar Lenis, RN Outcome: Progressing 09/18/2019 1022 by Baldemar Lenis, RN Outcome: Progressing   Problem: Safety: Goal: Ability to remain free from injury will improve 09/18/2019 1024 by Baldemar Lenis, RN Outcome: Progressing 09/18/2019 1022 by Baldemar Lenis, RN Outcome: Progressing   Problem: Skin Integrity: Goal: Risk for impaired skin integrity will decrease 09/18/2019 1024 by Baldemar Lenis, RN Outcome: Progressing 09/18/2019 1022 by Baldemar Lenis, RN Outcome: Progressing   Problem: Education: Goal: Knowledge of disease or condition will improve Outcome: Progressing Goal: Understanding of discharge needs will improve Outcome: Progressing   Problem: Health Behavior/Discharge Planning: Goal: Ability to identify changes in lifestyle to reduce recurrence of condition will improve Outcome: Progressing Goal: Identification of resources available to assist in meeting health care needs will improve Outcome: Progressing   Problem: Physical Regulation: Goal: Complications related to the disease process, condition or treatment will be avoided or minimized Outcome: Progressing   Problem: Safety: Goal: Ability to remain free from injury will improve Outcome: Progressing   Problem: Activity: Goal: Ability to tolerate increased activity will  improve Outcome: Progressing   Problem: Respiratory: Goal: Ability to maintain a clear airway and adequate ventilation will improve Outcome: Progressing   Problem: Role Relationship: Goal: Method of communication will improve Outcome: Progressing

## 2019-09-18 NOTE — Progress Notes (Signed)
ANTICOAGULATION CONSULT NOTE - Initial Consult  Pharmacy Consult for Heparin Indication: chest pain/ACS (cocaine induced)  Allergies  Allergen Reactions  . Tramadol Nausea And Vomiting and Other (See Comments)    Dizziness    Patient Measurements: Height: 6\' 1"  (185.4 cm) Weight: 90.2 kg (198 lb 13.7 oz) IBW/kg (Calculated) : 79.9 HEPARIN DW (KG): 85  Vital Signs: Temp: 97.3 F (36.3 C) (07/10 0600) Temp Source: Bladder (07/10 0114) BP: 97/62 (07/10 0600) Pulse Rate: 66 (07/10 0600)  Labs: Recent Labs    09/17/19 2113 09/18/19 0143 09/18/19 0312  HGB 13.6 11.5*  --   HCT 39.1 32.2*  --   PLT 291 216  --   LABPROT  --  13.6  --   INR  --  1.1  --   CREATININE 1.86* 1.35*  --   CKTOTAL 5,226* 7,185*  --   TROPONINIHS  --  876* 768*    Estimated Creatinine Clearance: 80.6 mL/min (A) (by C-G formula based on SCr of 1.35 mg/dL (H)).   Medical History: Past Medical History:  Diagnosis Date  . Diabetes mellitus without complication (HCC)   . Hypertension   . Polysubstance abuse (HCC)     Medications:  Medications Prior to Admission  Medication Sig Dispense Refill Last Dose  . acetaminophen (TYLENOL) 500 MG tablet Take 1,000 mg by mouth every 6 (six) hours as needed for mild pain or moderate pain.   prn at prn  . cetirizine (ZYRTEC) 10 MG tablet Take 10 mg by mouth daily.   prn at prn  . naloxone (NARCAN) nasal spray 4 mg/0.1 mL Use as needed for opiate overdose (Patient taking differently: Place 0.4 mg into the nose See admin instructions. Take 1 spray (0.4 mg totally) as needed for opiate overdose) 1 each 0 prn at prn  . naproxen sodium (ALEVE) 220 MG tablet Take 220 mg by mouth.   prn at prn    Assessment: Heparin initiated due to cocaine induced ACS.  No anticoagulants PTA.  Baseline labs ordered.  Pt received Heparin 5000 units SQ dose at 0444.  Goal of Therapy:  Heparin level 0.3-0.7 units/ml Monitor platelets by anticoagulation protocol: Yes   Plan:   Heparin 2000 units bolus x 1 then infusion at 1200 units/hr Check HL 6 hours after heparin started  11/19/19 A 09/18/2019,6:12 AM

## 2019-09-18 NOTE — Progress Notes (Signed)
Patient successfully extubated to 2L [place] with no complicaitons. Patient saturations are 99-100% at this time.

## 2019-09-18 NOTE — Progress Notes (Signed)
Pharmacy Antibiotic Note  Marc Marks is a 44 y.o. male admitted on 09/17/2019 with sepsis.  Pharmacy has been consulted for Cefepime and Vancomycin dosing.  7/10 Vancomycin and Cefepime discontinued. Pharmacy consulted to dose Unasyn for aspiration pneumonia.  Plan: Unasyn 3g IV q6h  Vancomycin 1750 mg IV Q 24 hrs. Goal AUC 400-550. Expected AUC: 509.1, Css min 11.7 SCr used: 1.86  Pt did not receive a full loading dose so will start next dose early  Height: 6\' 1"  (185.4 cm) Weight: 90.2 kg (198 lb 13.7 oz) IBW/kg (Calculated) : 79.9  Temp (24hrs), Avg:97.7 F (36.5 C), Min:95.9 F (35.5 C), Max:101.6 F (38.7 C)  Recent Labs  Lab 09/17/19 2113 09/17/19 2138 09/18/19 0143 09/18/19 0312 09/18/19 0620  WBC 20.3*  --  15.1*  --   --   CREATININE 1.86*  --  1.35*  --   --   LATICACIDVEN  --  3.3*  --  1.1 0.9    Estimated Creatinine Clearance: 80.6 mL/min (A) (by C-G formula based on SCr of 1.35 mg/dL (H)).    Allergies  Allergen Reactions  . Tramadol Nausea And Vomiting and Other (See Comments)    Dizziness    Antimicrobials this admission:  Vancomycin 7/9 >> 7/10   Cefepime 7/9  >> 7/10  Unasyn 7/10 >>   Dose adjustments this admission:   Microbiology results:  Thank you for allowing pharmacy to be a part of this patient's care.  9/9, PharmD, BCPS 09/18/2019 12:15 PM

## 2019-09-18 NOTE — Progress Notes (Addendum)
[facility] Sepsis note:   Sent message to ED RN to repeat LA, patient was on the way to the ICU. Messaged ICU NP and will order a repeat LA since first was 3.3. BC drawn before ABX administered and received a total of 3L IVF bolus. Admitted to ICU for continuous monitoring.   Leondro Coryell DNP [facility] RN 01:32 AM

## 2019-09-18 NOTE — Consult Note (Addendum)
Cardiology Consultation:   Patient ID: Marc Marks MRN: [id number]; DOB: 11-22-1975  Admit date: 09/17/2019 Date of Consult: 09/18/2019  Primary Care Provider: Patient, No Pcp Per [facility] Cardiologist: No primary care provider on file.  [facility] Electrophysiologist:  None  Consulting: Aleskerov   History of Present Illness:   [name] is a 44 yo male with history of DM, HTN and polysubstance abuse admitted with acute agitation and violent behavior while intoxicated. Intubated and sedated on arrival. His past history included cocaine, opiate and benzo abuse. UDS positive for cocaine, Thc and benzos. Cardiology consulted due to mild elevation of troponin by Dr. Karna Christmas. Pt unable to provide history as he is intubated and sedated.    Past Medical History:  Diagnosis Date  . Diabetes mellitus without complication (HCC)   . Hypertension   . Polysubstance abuse (HCC)     History reviewed. No pertinent surgical history.  No past surgeries per record. Pt unable to provide further details.   No home medications.   Inpatient Medications: Scheduled Meds: . Chlorhexidine Gluconate Cloth  6 each Topical Daily  . ipratropium-albuterol  3 mL Nebulization Q6H  . pantoprazole (PROTONIX) IV  40 mg Intravenous QHS  . polyethylene glycol  17 g Oral Daily   Continuous Infusions: . sodium chloride Stopped (09/18/19 0627)  . ceFEPime (MAXIPIME) IV Stopped (09/18/19 0629)  . fentaNYL infusion INTRAVENOUS 150 mcg/hr (09/18/19 0800)  . heparin 1,200 Units/hr (09/18/19 0800)  . metronidazole Stopped (09/18/19 0436)  . propofol (DIPRIVAN) infusion 30 mcg/kg/min (09/18/19 0800)  . vancomycin     PRN Meds: bisacodyl, docusate sodium  Allergies:    Allergies  Allergen Reactions  . Tramadol Nausea And Vomiting and Other (See Comments)    Dizziness    Social History:   Social History   Socioeconomic History  . Marital status: Legally Separated    Spouse name: Not on file    . Number of children: Not on file  . Years of education: Not on file  . Highest education level: Not on file  Occupational History  . Not on file  Tobacco Use  . Smoking status: Never Smoker  . Smokeless tobacco: Current User    Types: Snuff  Substance and Sexual Activity  . Alcohol use: No  . Drug use: Yes    Comment: [employer]  . Sexual activity: Not on file  Other Topics Concern  . Not on file  Social History Narrative  . Not on file   Social Determinants of Health   Financial Resource Strain:   . Difficulty of Paying Living Expenses:   Food Insecurity:   . Worried About Programme researcher, broadcasting/film/video in the Last Year:   . Barista in the Last Year:   Transportation Needs:   . Freight forwarder (Medical):   Marland Kitchen Lack of Transportation (Non-Medical):   Physical Activity:   . Days of Exercise per Week:   . Minutes of Exercise per Session:   Stress:   . Feeling of Stress :   Social Connections:   . Frequency of Communication with Friends and Family:   . Frequency of Social Gatherings with Friends and Family:   . Attends Religious Services:   . Active Member of Clubs or Organizations:   . Attends Banker Meetings:   Marland Kitchen Marital Status:   Intimate Partner Violence:   . Fear of Current or Ex-Partner:   . Emotionally Abused:   Marland Kitchen Physically Abused:   .  Sexually Abused:     Family History:  Unable to obtain family history No family history on file.   ROS:  Please see the history of present illness.  ive.     Physical Exam/Data:   Vitals:   09/18/19 0600 09/18/19 0700 09/18/19 0745 09/18/19 0800  BP: 97/62 97/60  (!) 93/58  Pulse: 66 67  74  Resp: 18 18  18   Temp: (!) 97.3 F (36.3 C) 97.9 F (36.6 C)  98.2 F (36.8 C)  TempSrc:  Bladder  Bladder  SpO2: 99% 100% 100% 96%  Weight:      Height:        Intake/Output Summary (Last 24 hours) at 09/18/2019 0901 Last data filed at 09/18/2019 0800 Gross per 24 hour  Intake 5223.14 ml  Output 2360 ml   Net 2863.14 ml   Last 3 Weights 09/18/2019 09/17/2019 08/01/2019  Weight (lbs) 198 lb 13.7 oz 187 lb 6.3 oz 185 lb 10 oz  Weight (kg) 90.2 kg 85 kg 84.2 kg     Body mass index is 26.24 kg/m.  General:  Well nourished, well developed, sedated and intubated HEENT: normal Lymph: no adenopathy Neck: no JVD Vascular: No carotid bruits Cardiac:  normal S1, S2; RRR; no murmur  Lungs:  clear to auscultation bilaterally, no wheezing, rhonchi or rales  Abd: soft, nontender, no hepatomegaly  Ext: no edema Musculoskeletal:  No deformities, BUE and BLE strength normal and equal Skin: warm and dry  Neuro:  sedated Psych:  sedated  EKG:  The EKG was personally reviewed and demonstrates:  Sinus, early repolarization Telemetry:  Telemetry was personally reviewed and demonstrates:  sinus  Relevant CV Studies:   Laboratory Data:  High Sensitivity Troponin:   Recent Labs  Lab 09/18/19 0143 09/18/19 0312  TROPONINIHS 876* 768*     Chemistry Recent Labs  Lab 09/17/19 2113 09/18/19 0143  NA 139 140  K 3.7 3.6  CL 101 106  CO2 21* 22  GLUCOSE 160* 124*  BUN 18 18  CREATININE 1.86* 1.35*  CALCIUM 9.3 8.2*  GFRNONAA 44* >60  GFRAA 51* >60  ANIONGAP 17* 12    Recent Labs  Lab 09/17/19 2113 09/18/19 0143  PROT 7.7 6.1*  ALBUMIN 4.4 3.6  AST 99* 115*  ALT 29 29  ALKPHOS 49 39  BILITOT 2.2* 2.0*   Hematology Recent Labs  Lab 09/17/19 2113 09/18/19 0143  WBC 20.3* 15.1*  RBC 4.53 3.80*  HGB 13.6 11.5*  HCT 39.1 32.2*  MCV 86.3 84.7  MCH 30.0 30.3  MCHC 34.8 35.7  RDW 13.7 13.8  PLT 291 216   BNPNo results for input(s): BNP, PROBNP in the last 168 hours.  DDimer No results for input(s): DDIMER in the last 168 hours.   Radiology/Studies:  CT Head Wo Contrast  Result Date: 09/17/2019 CLINICAL DATA:  Cerebral hemorrhage suspected Overdose. EXAM: CT HEAD WITHOUT CONTRAST TECHNIQUE: Contiguous axial images were obtained from the base of the skull through the vertex  without intravenous contrast. COMPARISON:  Head CT 07/31/2019 FINDINGS: Brain: No intracranial hemorrhage, mass effect, or midline shift. No hydrocephalus. The basilar cisterns are patent. Gray-white differentiation is preserved. No evidence of territorial infarct or acute ischemia. No extra-axial or intracranial fluid collection. Vascular: No hyperdense vessel or unexpected calcification. Skull: No fracture or focal lesion. Sinuses/Orbits: Paranasal sinuses and mastoid air cells are clear. The visualized orbits are unremarkable. Other: None. IMPRESSION: Negative noncontrast head CT. Electronically Signed   By: 08/02/2019.D.  On: 09/17/2019 22:48   CT ABDOMEN PELVIS W CONTRAST  Result Date: 09/17/2019 CLINICAL DATA:  Polysubstance abuse and overdose EXAM: CT ABDOMEN AND PELVIS WITH CONTRAST TECHNIQUE: Multidetector CT imaging of the abdomen and pelvis was performed using the standard protocol following bolus administration of intravenous contrast. CONTRAST:  OMNIPAQUE IOHEXOL 300 MG/ML  SOLN COMPARISON:  None. FINDINGS: Lower chest: No acute pleural or parenchymal lung disease. Minimal hypoventilatory changes within the lower lobes. Hepatobiliary: There are calcified gallstones without cholecystitis. The liver is unremarkable. Pancreas: Unremarkable. No pancreatic ductal dilatation or surrounding inflammatory changes. Spleen: Normal in size without focal abnormality. Adrenals/Urinary Tract: Adrenal glands are unremarkable. Kidneys are normal, without renal calculi, focal lesion, or hydronephrosis. Bladder is unremarkable. Foley catheter decompresses the bladder. Stomach/Bowel: Stomach is distended, with enteric catheter in the gastric lumen. No bowel obstruction or ileus. Normal appendix retrocecal location. No bowel wall thickening or inflammatory change. 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 Aorta is grossly  normal. No pathologic adenopathy. Reproductive: Prostate is unremarkable. Other: No abdominal wall hernia or abnormality. No abdominopelvic ascites. Musculoskeletal: No acute or destructive bony lesions. Reconstructed images demonstrate no additional findings. IMPRESSION: 1. Cholelithiasis without cholecystitis. 2. Distended stomach, with enteric catheter in the gastric lumen. 3. Otherwise no acute intra-abdominal or intrapelvic process. Electronically Signed   By: Sharlet Salina M.D.   On: 09/17/2019 22:47   DG Chest Port 1 View  Result Date: 09/17/2019 CLINICAL DATA:  Intubated EXAM: PORTABLE CHEST 1 VIEW COMPARISON:  07/31/2019 FINDINGS: Single frontal view of the chest demonstrates endotracheal tube overlying tracheal air column tip at level of thoracic inlet. Cardiac silhouette is unremarkable. No airspace disease, effusion, or pneumothorax. No acute bony abnormalities. IMPRESSION: 1. No complication after intubation.  No acute process. Electronically Signed   By: Sharlet Salina M.D.   On: 09/17/2019 21:55   {  Assessment and Plan:   1. Elevated troponin in setting of cocaine abuse: I do not think his mild troponin elevation represents a true ACS event. EKG not suggestive of injury current. OK to stop heparin drip. Agree with echo to assess LV function, wall motion.   For questions or updates, please contact [facility] Please consult [email or web address] for contact info under    Signed, Verne Carrow, MD  09/18/2019 9:01 AM

## 2019-09-18 NOTE — Progress Notes (Signed)
*  PRELIMINARY RESULTS* Echocardiogram 2D Echocardiogram has been performed.  Marc Marks 09/18/2019, 3:54 PM

## 2019-09-18 NOTE — Progress Notes (Signed)
Pharmacy Antibiotic Note  Marc Marks is a 44 y.o. male admitted on 09/17/2019 with sepsis.  Pharmacy has been consulted for Cefepime and Vancomycin dosing.  Plan: Cefepime 2gm IV q8hrs  Vancomycin 1750 mg IV Q 24 hrs. Goal AUC 400-550. Expected AUC: 509.1, Css min 11.7 SCr used: 1.86  Pt did not receive a full loading dose so will start next dose early  Height: 6\' 1"  (185.4 cm) Weight: 90.2 kg (198 lb 13.7 oz) IBW/kg (Calculated) : 79.9  Temp (24hrs), Avg:98 F (36.7 C), Min:96.4 F (35.8 C), Max:101.6 F (38.7 C)  Recent Labs  Lab 09/17/19 2113 09/17/19 2138  WBC 20.3*  --   CREATININE 1.86*  --   LATICACIDVEN  --  3.3*    Estimated Creatinine Clearance: 58.5 mL/min (A) (by C-G formula based on SCr of 1.86 mg/dL (H)).    Allergies  Allergen Reactions  . Tramadol Nausea And Vomiting and Other (See Comments)    Dizziness    Antimicrobials this admission:   >>    >>   Dose adjustments this admission:   Microbiology results:  BCx:   UCx:    Sputum:    MRSA PCR:   Thank you for allowing pharmacy to be a part of this patient's care.  2139 A 09/18/2019 2:18 AM

## 2019-09-18 NOTE — H&P (Signed)
PULMONARY / CRITICAL CARE MEDICINE  Name: Marc Marks MRN: [id number] DOB: [birth year]    LOS: 1   Reason for Admission: Acute overdose and acute hypoxic respiratory failure.  Brief patient description: 44 year old male with a history of polysubstance abuse who presented to the ED with acute agitation, became obtunded requiring intubation for airway protection.  Now with rhabdomyolysis, elevated troponins with subtle EKG changes acute renal failure  HPI: This is a 44 year old male with a medical history as indicated below who presented to the ED via EMS with acute agitation, and violent behavior following ingestion of illicit substances at home.  He is currently intubated and sedated hence history was obtained from ER records.  Per ED records, patient's mother indicated that when she went to pick him up from work he was not acting normal and appeared to be over under the influence of some substance.  Patient has a history of polysubstance abuse that includes cocaine opiates and benzos.  He also has a remote history of IV drug use.  Per patient's mother, patient became violent and very agitated hence she called EMS.  When EMS arrived, patient was severely agitated necessitating IM Versed and Haldol.  He arrived to ED with 3 officers restraining him.  At the ED, he became obtunded with agonal respirations hence he was intubated for airway protection.  His urine toxicology is positive for benzodiazepine, cocaine and cannabis.  His ED work-up was significant for an elevated CK level, creatinine of 1.86,  lactic acid of 3.3, and WBC of 20.3.  His chest x-ray was normal head and abdomen.  Initially the ED attending felt that patient might need a lumbar puncture to rule out meningitis but his urine appeared purulent so he felt that was the source of his leukocytosis hence he was started on empiric antibiotics to cover  for both gram-positive and gram-negative organisms and the lumbar puncture was not  performed.  Patient is being admitted to the ICU for further management. He is comfortable on the vent.  His EKG shows subtle ST changes and his troponin is trending up.  A cardiology consult has been initiated.  SIGNIFICANT EVENTS: 09/17/2019: Admitted with severe agitation, delirium, and elevated troponin  Past Medical History:  Diagnosis Date  . Diabetes mellitus without complication (HCC)   . Hypertension   . Polysubstance abuse (HCC)    History reviewed. No pertinent surgical history. No current facility-administered medications on file prior to encounter.   Current Outpatient Medications on File Prior to Encounter  Medication Sig  . acetaminophen (TYLENOL) 500 MG tablet Take 1,000 mg by mouth every 6 (six) hours as needed for mild pain or moderate pain.  . cetirizine (ZYRTEC) 10 MG tablet Take 10 mg by mouth daily.  . naloxone (NARCAN) nasal spray 4 mg/0.1 mL Use as needed for opiate overdose (Patient taking differently: Place 0.4 mg into the nose See admin instructions. Take 1 spray (0.4 mg totally) as needed for opiate overdose)  . naproxen sodium (ALEVE) 220 MG tablet Take 220 mg by mouth.    Allergies Allergies  Allergen Reactions  . Tramadol Nausea And Vomiting and Other (See Comments)    Dizziness    Family History No family history on file. Social History  reports that he has never smoked. His smokeless tobacco use includes snuff. He reports current drug use. He reports that he does not drink alcohol.  Review Of Systems: Unable to obtain as patient is intubated and sedated  VITAL SIGNS: BP 93/66 (BP  Location: Left Arm)   Pulse 62   Temp (!) 96.4 F (35.8 C) (Bladder)   Resp 19   Ht 6\' 1"  (1.854 m)   Wt 90.2 kg   SpO2 98%   BMI 26.24 kg/m   HEMODYNAMICS:    VENTILATOR SETTINGS: Vent Mode: AC FiO2 (%):  [30 %-100 %] 30 % Set Rate:  [18 bmp] 18 bmp Vt Set:  [550 mL] 550 mL PEEP:  [5 cmH20] 5 cmH20  INTAKE / OUTPUT: No intake/output data  recorded.  PHYSICAL EXAMINATION: General: Intubated and sedated, in no acute distress HEENT: Normocephalic and atraumatic, pupils are pinpoint, sluggish, positive corneal reflexes Neuro: Withdraws to noxious stimulus, sedated Cardiovascular: Apical pulse regular, S1-S2, no murmur regurg or gallop, +2 pulses bilaterally, no edema Lungs: ET tube in place, bilateral breath sounds without any wheezes or rhonchi Abdomen: Nondistended, normal bowel sounds in all 4 quadrants, palpation reveals no organomegaly Musculoskeletal: No joint deformities, positive range of motion Skin: Warm and dry  LABS:  BMET Recent Labs  Lab 09/17/19 2113  NA 139  K 3.7  CL 101  CO2 21*  BUN 18  CREATININE 1.86*  GLUCOSE 160*    Electrolytes Recent Labs  Lab 09/17/19 2113  CALCIUM 9.3    CBC Recent Labs  Lab 09/17/19 2113  WBC 20.3*  HGB 13.6  HCT 39.1  PLT 291    Coag's No results for input(s): APTT, INR in the last 168 hours.  Sepsis Markers Recent Labs  Lab 09/17/19 2113 09/17/19 2138  LATICACIDVEN  --  3.3*  PROCALCITON 3.10  --     ABG Recent Labs  Lab 09/17/19 2141  PHART 7.29*  PCO2ART 51*  PO2ART 340*    Liver Enzymes Recent Labs  Lab 09/17/19 2113  AST 99*  ALT 29  ALKPHOS 49  BILITOT 2.2*  ALBUMIN 4.4    Cardiac Enzymes No results for input(s): TROPONINI, PROBNP in the last 168 hours.  Glucose Recent Labs  Lab 09/18/19 0112  GLUCAP 124*    Imaging CT Head Wo Contrast  Result Date: 09/17/2019 CLINICAL DATA:  Cerebral hemorrhage suspected Overdose. EXAM: CT HEAD WITHOUT CONTRAST TECHNIQUE: Contiguous axial images were obtained from the base of the skull through the vertex without intravenous contrast. COMPARISON:  Head CT 07/31/2019 FINDINGS: Brain: No intracranial hemorrhage, mass effect, or midline shift. No hydrocephalus. The basilar cisterns are patent. Gray-white differentiation is preserved. No evidence of territorial infarct or acute ischemia.  No extra-axial or intracranial fluid collection. Vascular: No hyperdense vessel or unexpected calcification. Skull: No fracture or focal lesion. Sinuses/Orbits: Paranasal sinuses and mastoid air cells are clear. The visualized orbits are unremarkable. Other: None. IMPRESSION: Negative noncontrast head CT. Electronically Signed   By: 08/02/2019 M.D.   On: 09/17/2019 22:48   CT ABDOMEN PELVIS W CONTRAST  Result Date: 09/17/2019 CLINICAL DATA:  Polysubstance abuse and overdose EXAM: CT ABDOMEN AND PELVIS WITH CONTRAST TECHNIQUE: Multidetector CT imaging of the abdomen and pelvis was performed using the standard protocol following bolus administration of intravenous contrast. CONTRAST:  11/18/2019 OMNIPAQUE IOHEXOL 300 MG/ML  SOLN COMPARISON:  None. FINDINGS: Lower chest: No acute pleural or parenchymal lung disease. Minimal hypoventilatory changes within the lower lobes. Hepatobiliary: There are calcified gallstones without cholecystitis. The liver is unremarkable. Pancreas: Unremarkable. No pancreatic ductal dilatation or surrounding inflammatory changes. Spleen: Normal in size without focal abnormality. Adrenals/Urinary Tract: Adrenal glands are unremarkable. Kidneys are normal, without renal calculi, focal lesion, or hydronephrosis. Bladder is unremarkable. Foley  catheter decompresses the bladder. Stomach/Bowel: Stomach is distended, with enteric catheter in the gastric lumen. No bowel obstruction or ileus. Normal appendix retrocecal location. No bowel wall thickening or inflammatory change. 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 Aorta is grossly normal. No pathologic adenopathy. Reproductive: Prostate is unremarkable. Other: No abdominal wall hernia or abnormality. No abdominopelvic ascites. Musculoskeletal: No acute or destructive bony lesions. Reconstructed images demonstrate no additional findings. IMPRESSION: 1.  Cholelithiasis without cholecystitis. 2. Distended stomach, with enteric catheter in the gastric lumen. 3. Otherwise no acute intra-abdominal or intrapelvic process. Electronically Signed   By: Sharlet Salina M.D.   On: 09/17/2019 22:47   DG Chest Port 1 View  Result Date: 09/17/2019 CLINICAL DATA:  Intubated EXAM: PORTABLE CHEST 1 VIEW COMPARISON:  07/31/2019 FINDINGS: Single frontal view of the chest demonstrates endotracheal tube overlying tracheal air column tip at level of thoracic inlet. Cardiac silhouette is unremarkable. No airspace disease, effusion, or pneumothorax. No acute bony abnormalities. IMPRESSION: 1. No complication after intubation.  No acute process. Electronically Signed   By: Sharlet Salina M.D.   On: 09/17/2019 21:55    STUDIES:  2D echo pending  CULTURES: Blood cultures x2: Urine culture  ANTIBIOTICS: Vancomycin: Cefepime: Flagyl:  LINES/TUBES: Peripheral IVs Foley  DISCUSSION: This is a 44 year old male presenting with acute delirium secondary to overdose from cocaine, benzos and cannabis.  There is a possibility that the patient also ingested other substances that are not showing up in the toxicology.  ASSESSMENT / PLAN:  PULMONARY A: Acute hypoxic respiratory failure Lactic acidosis P:   Full vent support with current settings Serial ABGs and chest x-ray Weaning trials as tolerated VAP protocol  CARDIOVASCULAR A:  Elevated troponin with subtle EKG changes-possible cocaine induced ACS P:  Cardiology consult initiated, awaiting callback Heparin infusion Cycle cardiac enzymes and EKGs 2D echo  RENAL A:   Rhabdomyolysis Acute renal failure P:   IV hydration Trend renal indicis Avoid nephrotoxic drugs Monitor and correct electrolytes Trend CK level  INFECTIOUS A:   Sepsis of unknown source-procalcitonin greater than 2 and lactic acid of 3.3, exact source unclear but most likely urinary P:   Continue broad-spectrum coverage Follow-up  cultures Trend procalcitonin and adjust antibiotics Monitor fever curve  Psychiatry A:   Polysubstance abuse with multiple hospitalizations P:   Transitional care consult initiated Monitor closely for withdrawal Patient will require inpatient treatment for substance abuse upon discharge  NEUROLOGIC A:   Acute encephalopathy secondary to polysubstance abuse Acute delirium P:   RASS goal: 0 to -1 Fentanyl and fall for vent associated discomfort and sedation Patient will require a safety sitter upon extubation  Best Practice: Code Status: Full code Diet: N.p.o. GI prophylaxis: PPI VTE prophylaxis: Heparin infusion  FAMILY  - Updates: No family at bedside.  Will update when available.  Plan of care discussed with Dr. Warrick Parisian at [facility].   Ameliya Nicotra [facility]. [facility] ANP-BC Pulmonary and Critical Care Medicine [facility] Pager [phone] or [phone]  NB: This document was prepared using Dragon voice recognition software and may include unintentional dictation errors.    09/18/2019, 2:07 AM

## 2019-09-18 NOTE — Progress Notes (Signed)
Pt extubated at 1100 with [facility] RRT. Pt VS stable. Pt able to state name and able to communicate need to use the bathroom. No hoarseness or other signs of vocal chord inflammation. Pt placed on 2LNC.

## 2019-09-19 DIAGNOSIS — A419 Sepsis, unspecified organism: Secondary | ICD-10-CM

## 2019-09-19 DIAGNOSIS — R6520 Severe sepsis without septic shock: Secondary | ICD-10-CM

## 2019-09-19 DIAGNOSIS — J9601 Acute respiratory failure with hypoxia: Secondary | ICD-10-CM

## 2019-09-19 LAB — CBC
HCT: 33.4 % — ABNORMAL LOW (ref 39.0–52.0)
Hemoglobin: 11.4 g/dL — ABNORMAL LOW (ref 13.0–17.0)
MCH: 30.3 pg (ref 26.0–34.0)
MCHC: 34.1 g/dL (ref 30.0–36.0)
MCV: 88.8 fL (ref 80.0–100.0)
Platelets: 180 10*3/uL (ref 150–400)
RBC: 3.76 MIL/uL — ABNORMAL LOW (ref 4.22–5.81)
RDW: 13.9 % (ref 11.5–15.5)
WBC: 9.7 10*3/uL (ref 4.0–10.5)
nRBC: 0 % (ref 0.0–0.2)

## 2019-09-19 LAB — URINE CULTURE: Culture: NO GROWTH

## 2019-09-19 LAB — RENAL FUNCTION PANEL
Albumin: 3.3 g/dL — ABNORMAL LOW (ref 3.5–5.0)
Anion gap: 7 (ref 5–15)
BUN: 8 mg/dL (ref 6–20)
CO2: 27 mmol/L (ref 22–32)
Calcium: 8 mg/dL — ABNORMAL LOW (ref 8.9–10.3)
Chloride: 104 mmol/L (ref 98–111)
Creatinine, Ser: 0.98 mg/dL (ref 0.61–1.24)
GFR calc Af Amer: >60 mL/min (ref >60)
GFR calc non Af Amer: >60 mL/min (ref >60)
Glucose, Bld: 100 mg/dL — ABNORMAL HIGH (ref 70–99)
Phosphorus: 3.1 mg/dL (ref 2.5–4.6)
Potassium: 3.5 mmol/L (ref 3.5–5.1)
Sodium: 138 mmol/L (ref 135–145)

## 2019-09-19 LAB — MAGNESIUM: Magnesium: 2.2 mg/dL (ref 1.7–2.4)

## 2019-09-19 LAB — PROCALCITONIN: Procalcitonin: 4.34 ng/mL

## 2019-09-19 MED ORDER — AMOXICILLIN-POT CLAVULANATE 875-125 MG PO TABS: 1.0000 | ORAL_TABLET | Freq: Two times a day (BID) | ORAL | 0 refills | Status: AC

## 2019-09-19 MED ORDER — POTASSIUM CHLORIDE 20 MEQ/15ML (10%) PO SOLN
40.0000 meq | Freq: Once | ORAL | Status: AC
  Administered 2019-09-19: 40 meq via ORAL
  Filled 2019-09-19 (×2): qty 30

## 2019-09-19 NOTE — Progress Notes (Signed)
Progress Note  Patient Name: Teofil Maniaci Date of Encounter: 09/19/2019  [facility] Cardiologist: No primary care provider on file.   Subjective   No chest pain. No events. Now extubated.   Inpatient Medications    Scheduled Meds: . Chlorhexidine Gluconate Cloth  6 each Topical Daily  . diphenhydrAMINE  50 mg Intravenous TID  .  morphine injection  2 mg Intravenous TID AC  . pantoprazole (PROTONIX) IV  40 mg Intravenous QHS  . polyethylene glycol  17 g Oral Daily  . potassium chloride  40 mEq Oral Once   Continuous Infusions: . ampicillin-sulbactam (UNASYN) IV 3 g (09/19/19 0530)   PRN Meds: bisacodyl, docusate sodium   Vital Signs    Vitals:   09/19/19 0500 09/19/19 0600 09/19/19 0700 09/19/19 0800  BP: 125/88 126/87 130/76 133/88  Pulse: (!) 49 (!) 48 (!) 47 (!) 51  Resp: 14 13 12 13   Temp: 98.6 F (37 C)   98.4 F (36.9 C)  TempSrc: Axillary   Oral  SpO2: 95% 94% 94% 94%  Weight:      Height:        Intake/Output Summary (Last 24 hours) at 09/19/2019 1016 Last data filed at 09/19/2019 0740 Gross per 24 hour  Intake 944.29 ml  Output 3450 ml  Net -2505.71 ml   Last 3 Weights 09/19/2019 09/18/2019 09/17/2019  Weight (lbs) 199 lb 1.2 oz 198 lb 13.7 oz 187 lb 6.3 oz  Weight (kg) 90.3 kg 90.2 kg 85 kg      Telemetry     Sinus- Personally Reviewed  ECG    No AM EKG- Personally Reviewed  Physical Exam   GEN: No acute distress.   Neck: No JVD Cardiac: RRR, no murmurs, rubs, or gallops.  Respiratory: Clear to auscultation bilaterally. GI: Soft, nontender, non-distended  MS: No edema; No deformity. Neuro:  Nonfocal  Psych: Normal affect   Labs    High Sensitivity Troponin:   Recent Labs  Lab 09/18/19 0143 09/18/19 0312  TROPONINIHS 876* 768*      Chemistry Recent Labs  Lab 09/17/19 2113 09/18/19 0143 09/19/19 0519  NA 139 140 138  K 3.7 3.6 3.5  CL 101 106 104  CO2 21* 22 27  GLUCOSE 160* 124* 100*  BUN 18 18 8   CREATININE  1.86* 1.35* 0.98  CALCIUM 9.3 8.2* 8.0*  PROT 7.7 6.1*  --   ALBUMIN 4.4 3.6 3.3*  AST 99* 115*  --   ALT 29 29  --   ALKPHOS 49 39  --   BILITOT 2.2* 2.0*  --   GFRNONAA 44* >60 >60  GFRAA 51* >60 >60  ANIONGAP 17* 12 7     Hematology Recent Labs  Lab 09/17/19 2113 09/18/19 0143 09/19/19 0519  WBC 20.3* 15.1* 9.7  RBC 4.53 3.80* 3.76*  HGB 13.6 11.5* 11.4*  HCT 39.1 32.2* 33.4*  MCV 86.3 84.7 88.8  MCH 30.0 30.3 30.3  MCHC 34.8 35.7 34.1  RDW 13.7 13.8 13.9  PLT 291 216 180    BNPNo results for input(s): BNP, PROBNP in the last 168 hours.   DDimer No results for input(s): DDIMER in the last 168 hours.   Radiology    CT Head Wo Contrast  Result Date: 09/17/2019 CLINICAL DATA:  Cerebral hemorrhage suspected Overdose. EXAM: CT HEAD WITHOUT CONTRAST TECHNIQUE: Contiguous axial images were obtained from the base of the skull through the vertex without intravenous contrast. COMPARISON:  Head CT 07/31/2019 FINDINGS: Brain: No  intracranial hemorrhage, mass effect, or midline shift. No hydrocephalus. The basilar cisterns are patent. Gray-white differentiation is preserved. No evidence of territorial infarct or acute ischemia. No extra-axial or intracranial fluid collection. Vascular: No hyperdense vessel or unexpected calcification. Skull: No fracture or focal lesion. Sinuses/Orbits: Paranasal sinuses and mastoid air cells are clear. The visualized orbits are unremarkable. Other: None. IMPRESSION: Negative noncontrast head CT. Electronically Signed   By: Narda Rutherford M.D.   On: 09/17/2019 22:48   CT ABDOMEN PELVIS W CONTRAST  Result Date: 09/17/2019 CLINICAL DATA:  Polysubstance abuse and overdose EXAM: CT ABDOMEN AND PELVIS WITH CONTRAST TECHNIQUE: Multidetector CT imaging of the abdomen and pelvis was performed using the standard protocol following bolus administration of intravenous contrast. CONTRAST:  OMNIPAQUE IOHEXOL 300 MG/ML  SOLN COMPARISON:  None. FINDINGS: Lower  chest: No acute pleural or parenchymal lung disease. Minimal hypoventilatory changes within the lower lobes. Hepatobiliary: There are calcified gallstones without cholecystitis. The liver is unremarkable. Pancreas: Unremarkable. No pancreatic ductal dilatation or surrounding inflammatory changes. Spleen: Normal in size without focal abnormality. Adrenals/Urinary Tract: Adrenal glands are unremarkable. Kidneys are normal, without renal calculi, focal lesion, or hydronephrosis. Bladder is unremarkable. Foley catheter decompresses the bladder. Stomach/Bowel: Stomach is distended, with enteric catheter in the gastric lumen. No bowel obstruction or ileus. Normal appendix retrocecal location. No bowel wall thickening or inflammatory change. 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 Aorta is grossly normal. No pathologic adenopathy. Reproductive: Prostate is unremarkable. Other: No abdominal wall hernia or abnormality. No abdominopelvic ascites. Musculoskeletal: No acute or destructive bony lesions. Reconstructed images demonstrate no additional findings. IMPRESSION: 1. Cholelithiasis without cholecystitis. 2. Distended stomach, with enteric catheter in the gastric lumen. 3. Otherwise no acute intra-abdominal or intrapelvic process. Electronically Signed   By: Sharlet Salina M.D.   On: 09/17/2019 22:47   DG Chest Port 1 View  Result Date: 09/17/2019 CLINICAL DATA:  Intubated EXAM: PORTABLE CHEST 1 VIEW COMPARISON:  07/31/2019 FINDINGS: Single frontal view of the chest demonstrates endotracheal tube overlying tracheal air column tip at level of thoracic inlet. Cardiac silhouette is unremarkable. No airspace disease, effusion, or pneumothorax. No acute bony abnormalities. IMPRESSION: 1. No complication after intubation.  No acute process. Electronically Signed   By: Sharlet Salina M.D.   On: 09/17/2019 21:55   ECHOCARDIOGRAM  COMPLETE  Result Date: 09/18/2019    ECHOCARDIOGRAM REPORT   Patient Name:   MUNIR VICTORIAN Date of Exam: 09/18/2019 Medical Rec #:  [id number]    Height:       73.0 in Accession #:    [id number]   Weight:       198.9 lb Date of Birth:  1975/10/27    BSA:          2.146 m Patient Age:    44 years     BP:           93/58 mmHg Patient Gender: M            HR:           74 bpm. Exam Location:  [facility] Procedure: 2D Echo, Cardiac Doppler and Color Doppler Indications:     Acute Respiratory Insufficiency 518.82 / R06.89                  Elevated Troponin  History:         Patient has no prior history of Echocardiogram examinations.  Risk Factors:Diabetes and Hypertension.  Sonographer:     Neysa Bonito Roar Referring Phys:  [id number] MAGDALENE S TUKOV-YUAL Diagnosing Phys: Adrian Blackwater MD IMPRESSIONS  1. Left ventricular ejection fraction, by estimation, is 45 to 50%. The left ventricle has mildly decreased function. The left ventricle has no regional wall motion abnormalities. Left ventricular diastolic parameters are consistent with Grade I diastolic dysfunction (impaired relaxation).  2. Right ventricular systolic function is normal. The right ventricular size is normal. There is mildly elevated pulmonary artery systolic pressure.  3. Left atrial size was moderately dilated.  4. Right atrial size was moderately dilated.  5. The mitral valve is normal in structure. Moderate mitral valve regurgitation. No evidence of mitral stenosis.  6. Tricuspid valve regurgitation is moderate to severe.  7. The aortic valve is normal in structure. Aortic valve regurgitation is not visualized. No aortic stenosis is present.  8. The inferior vena cava is normal in size with greater than 50% respiratory variability, suggesting right atrial pressure of 3 mmHg. FINDINGS  Left Ventricle: Left ventricular ejection fraction, by estimation, is 45 to 50%. The left ventricle has mildly decreased function. The left ventricle has no regional  wall motion abnormalities. The left ventricular internal cavity size was normal in size. There is no left ventricular hypertrophy. Left ventricular diastolic parameters are consistent with Grade I diastolic dysfunction (impaired relaxation). Right Ventricle: The right ventricular size is normal. No increase in right ventricular wall thickness. Right ventricular systolic function is normal. There is mildly elevated pulmonary artery systolic pressure. The tricuspid regurgitant velocity is 2.67  m/s, and with an assumed right atrial pressure of 10 mmHg, the estimated right ventricular systolic pressure is 38.5 mmHg. Left Atrium: Left atrial size was moderately dilated. Right Atrium: Right atrial size was moderately dilated. Pericardium: There is no evidence of pericardial effusion. Mitral Valve: The mitral valve is normal in structure. Normal mobility of the mitral valve leaflets. Moderate mitral valve regurgitation. No evidence of mitral valve stenosis. Tricuspid Valve: The tricuspid valve is normal in structure. Tricuspid valve regurgitation is moderate to severe. No evidence of tricuspid stenosis. Aortic Valve: The aortic valve is normal in structure. Aortic valve regurgitation is not visualized. No aortic stenosis is present. Aortic valve mean gradient measures 6.0 mmHg. Aortic valve peak gradient measures 11.6 mmHg. Aortic valve area, by VTI measures 2.30 cm. Pulmonic Valve: The pulmonic valve was normal in structure. Pulmonic valve regurgitation is mild. No evidence of pulmonic stenosis. Aorta: The aortic root is normal in size and structure. Venous: The inferior vena cava is normal in size with greater than 50% respiratory variability, suggesting right atrial pressure of 3 mmHg. IAS/Shunts: No atrial level shunt detected by color flow Doppler.  LEFT VENTRICLE PLAX 2D LVIDd:         5.03 cm  Diastology LVIDs:         3.81 cm  LV e' lateral:   15.20 cm/s LV PW:         1.21 cm  LV E/e' lateral: 7.2 LV IVS:         1.24 cm  LV e' medial:    8.70 cm/s LVOT diam:     2.10 cm  LV E/e' medial:  12.6 LV SV:         65 LV SV Index:   30 LVOT Area:     3.46 cm  RIGHT VENTRICLE RV Mid diam:    3.71 cm RV S prime:     14.60 cm/s TAPSE (M-mode):  2.4 cm LEFT ATRIUM             Index       RIGHT ATRIUM           Index LA diam:        4.00 cm 1.86 cm/m  RA Area:     17.40 cm LA Vol (A2C):   55.9 ml 26.04 ml/m RA Volume:   47.00 ml  21.90 ml/m LA Vol (A4C):   55.9 ml 26.04 ml/m LA Biplane Vol: 57.6 ml 26.84 ml/m  AORTIC VALVE                    PULMONIC VALVE AV Area (Vmax):    2.18 cm     PV Vmax:        1.30 m/s AV Area (Vmean):   2.00 cm     PV Peak grad:   6.8 mmHg AV Area (VTI):     2.30 cm     RVOT Peak grad: 2 mmHg AV Vmax:           170.00 cm/s AV Vmean:          115.000 cm/s AV VTI:            0.283 m AV Peak Grad:      11.6 mmHg AV Mean Grad:      6.0 mmHg LVOT Vmax:         107.00 cm/s LVOT Vmean:        66.500 cm/s LVOT VTI:          0.188 m LVOT/AV VTI ratio: 0.66  AORTA Ao Root diam: 2.90 cm MITRAL VALVE                TRICUSPID VALVE MV Area (PHT): 4.06 cm     TR Peak grad:   28.5 mmHg MV Decel Time: 187 msec     TR Vmax:        267.00 cm/s MV E velocity: 110.00 cm/s MV A velocity: 72.20 cm/s   SHUNTS MV E/A ratio:  1.52         Systemic VTI:  0.19 m MV A Prime:    5.2 cm/s     Systemic Diam: 2.10 cm Adrian BlackwaterShaukat Khan MD Electronically signed by Adrian BlackwaterShaukat Khan MD Signature Date/Time: 09/18/2019/4:37:12 PM    Final     Cardiac Studies   Echo above  Patient Profile     44 y.o. male with history of DM, HTN and polysubstance abuse admitted with acute agitation and violent behavior while intoxicated. Intubated and sedated on arrival. His past history included cocaine, opiate and benzo abuse. UDS positive for cocaine, Thc and benzos. Cardiology consulted due to mild elevation of troponin.   Assessment & Plan    1. Elevated troponin: He is now extubated. No chest pain. Sinus on tele. By my read, echo shows normal LV  systolic function and mild MR. His elevated troponin does not represent ACS. This is likely due to demand ischemia in setting of respiratory failure secondary to drug overdose. No further cardiac workup.   Cardiology will sign off. Please call with questions.   For questions or updates, please contact [facility] Please consult [email or web address] for contact info under        Signed, Verne Carrowhristopher Aubrei Bouchie, MD  09/19/2019, 10:16 AM

## 2019-09-19 NOTE — Plan of Care (Signed)

## 2019-09-19 NOTE — TOC Transition Note (Signed)
Transition of Care [facility]) - CM/SW Discharge Note   Patient Details  Name: Marc Marks MRN: [id number] Date of Birth: 12-Jun-1975  [facility] CM/SW Contact:  Meriel Flavors, LCSW Phone Number: 09/19/2019, 1:33 PM   Clinical Narrative:    Dr. Neysa Bonito requested [facility] consult for SUD and possible rehab. TOC social worker met with patient, ask pt if he was interested in locating a rehab to go to, Pt stated he does not feel like talking right now so I  provided him a list of resources and instructed him to let his nurse know if he changes his mind. My part is complete and should not hold up discharge.          Patient Goals and CMS Choice        Discharge Placement                       Discharge Plan and Services                                     Social Determinants of Health (SDOH) Interventions     Readmission Risk Interventions No flowsheet data found.

## 2019-09-19 NOTE — Consult Note (Signed)
PHARMACY CONSULT NOTE  Pharmacy Consult for Electrolyte Monitoring and Replacement   Recent Labs: Potassium (mmol/L)  Date Value  09/19/2019 3.5   Magnesium (mg/dL)  Date Value  68/02/7516 2.2   Calcium (mg/dL)  Date Value  00/17/4944 8.0 (L)   Albumin (g/dL)  Date Value  96/75/9163 3.3 (L)   Phosphorus (mg/dL)  Date Value  84/66/5993 3.1   Sodium (mmol/L)  Date Value  09/19/2019 138   Corrected Ca: 8.56 mg/dL  Assessment: 44 year old male with a medical history as indicated below who presented to the ED via EMS with acute agitation, and violent behavior following overdose from cocaine, benzos and cannabis and elevated troponin with subtle EKG changes-possible cocaine induced ACS  Goal of Therapy:  Potassium 4.0 - 5.1 mmol/L Magnesium 2.0 - 2.4 mg/dL All Other Electrolytes WNL  Plan:   40 mEq oral KCl x 1  F/U electrolytes in am 7/12  Lowella Bandy ,PharmD Clinical Pharmacist 09/19/2019 9:18 AM

## 2019-09-19 NOTE — Discharge Summary (Signed)
Physician Discharge Summary  Marc Marks JSE:[id number] DOB: [birth year]   PCP: Patient, No Pcp Per  Admit date: 09/17/2019 Discharge date: 09/19/2019 Length of Stay: 2 days   Code Status: Full Code  Admitted From:  Home Discharged to:   [place]:  None  Equipment/Devices:  None Discharge Condition:  Stable  Recommendations for Outpatient Follow-up   1. Follow up with outpatient psychiatry and drug rehab  Hospital Summary  This is a 44 year old male with a past medical history of diabetes, hypertension, polysubstance abuse with multiple hospitalizations in the past, adjustment disorder who presented to the ED via EMS with acute agitation and violent behavior following ingestion of illicit substances at home.  Per prior notes, patient's mother indicated that when she went to pick him up from work he was not acting himself and became violent and very agitated and so she called EMS.  When EMS arrived, patient was severely agitated and received IM Versed and Haldol he arrived to the ED with 3 officers restraining him.  At the ED he became obtunded with agonal respirations requiring intubation for airway protection.  UDS positive for benzodiazepines, cocaine and cannabis.  In the ED he was noted to have elevated CK level, creatinine 1.86, lactic acid of 3.3 and WBC of 20.3.  Imaging unremarkable.  Initially the ED attending felt that the patient may need an LP to rule out meningitis but his UA was purulent and so this was thought to be the source of his leukocytosis and was started on empiric antibiotics and LP was not performed.  EKG showed subtle ST changes with elevated troponin and patient was started on a heparin drip and cardiology was consulted.  He was admitted to the ICU.  Patient was successfully extubated on 7/10.  Per cardiology, patient's elevated troponin in the setting of cocaine abuse likely did not represent a true ACS event and EKG was not suggestive of ACS.  Heparin drip  stopped and echo was obtained.  Echo showed EF 45 to 50% with grade 1 diastolic dysfunction and moderately dilated left and right atria and moderate MR.  Critical care recommended inpatient drug rehab.  Social work was consulted to assist with this who provided the patient with resources prior to discharge.  Patient was discharged in stable condition with resources and education.   A & P   Active Problems:   Acute respiratory failure (HCC)   Delirium   Sepsis with acute respiratory failure (HCC)   Elevated troponin   1. Acute hypoxic respiratory failure secondary to encephalopathy polysubstance abuse requiring intubation 1. Extubated and tolerating room air  2. Polysubstance abuse with multiple hospitalizations in the past for the same 1. UDS positive for benzos, cocaine THC, positive for opiates in the past 2. Provided resources for rehab at discharge 3. Psych was initially consulted however did not see the patient as he did not have a primary underlying psychiatric issue 4. Advised on cessation of illicit substances 5. Cleared by PT  3. Sepsis unknown source, resolved 1. Urinary versus aspiration versus drug use versus other 2. Urine and blood cultures no growth to date 3. Lactic acid 3.3 on admission, leukocytosis of 20.3, respiratory failure with elevated procalcitonin 4. Started on Unasyn with resolved lab abnormalities 5. Discharged on 3 days of Augmentin for total 5 days antibiotics for empiric coverage  4. Acute metabolic encephalopathy and delirium secondary to polysubstance abuse and hypoxic respiratory failure, resolved  5. Diabetes, stable  6. Mild rhabdomyolysis, stable  7. AKI, resolved    Consultants  . PCCM  Procedures  . Intubated 7/9-7/10  Antibiotics   Anti-infectives (From admission, onward)   Start     Dose/Rate Route Frequency Ordered Stop   09/20/19 0000  amoxicillin-clavulanate (AUGMENTIN) 875-125 MG tablet     Discontinue     1 tablet  Oral 2 times daily 09/19/19 1359 09/23/19 2359   09/18/19 1600  vancomycin (VANCOREADY) IVPB 1750 mg/350 mL  Status:  Discontinued        1,750 mg 175 mL/hr over 120 Minutes Intravenous Every 24 hours 09/18/19 0218 09/18/19 1149   09/18/19 1215  Ampicillin-Sulbactam (UNASYN) 3 g in sodium chloride 0.9 % 100 mL IVPB     Discontinue     3 g 200 mL/hr over 30 Minutes Intravenous Every 6 hours 09/18/19 1212     09/18/19 0600  ceFEPIme (MAXIPIME) 2 g in sodium chloride 0.9 % 100 mL IVPB  Status:  Discontinued        2 g 200 mL/hr over 30 Minutes Intravenous Every 8 hours 09/18/19 0140 09/18/19 1149   09/18/19 0400  metroNIDAZOLE (FLAGYL) IVPB 500 mg  Status:  Discontinued        500 mg 100 mL/hr over 60 Minutes Intravenous Every 8 hours 09/18/19 0127 09/18/19 1149   09/18/19 0000  doxycycline (VIBRAMYCIN) 100 mg in sodium chloride 0.9 % 250 mL IVPB        100 mg 125 mL/hr over 120 Minutes Intravenous  Once 09/17/19 2325 09/18/19 0231   09/17/19 2145  ceFEPIme (MAXIPIME) 2 g in sodium chloride 0.9 % 100 mL IVPB        2 g 200 mL/hr over 30 Minutes Intravenous  Once 09/17/19 2131 09/17/19 2220   09/17/19 2145  metroNIDAZOLE (FLAGYL) IVPB 500 mg        500 mg 100 mL/hr over 60 Minutes Intravenous  Once 09/17/19 2131 09/18/19 0017   09/17/19 2145  vancomycin (VANCOCIN) IVPB 1000 mg/200 mL premix        1,000 mg 200 mL/hr over 60 Minutes Intravenous  Once 09/17/19 2131 09/17/19 2307       Subjective   Seen and examined at bedside no acute distress resting comfortably.  States that he has used substances today chronic lower extremity pain and is unable to obtain opiates from his physicians and so he obtains them illegally.  He was advised not to use illicit substances.  Denies any chest pain, shortness breath, nausea, vomiting or other issues.  No overnight events.   Objective   Discharge Exam: Vitals:   09/19/19 1100 09/19/19 1200  BP: 136/90 138/82  Pulse: (!) 51 (!) 56  Resp: 19 14   Temp:  98.4 F (36.9 C)  SpO2: 98% 96%   Vitals:   09/19/19 0900 09/19/19 1000 09/19/19 1100 09/19/19 1200  BP: 134/79 127/76 136/90 138/82  Pulse: (!) 50 (!) 45 (!) 51 (!) 56  Resp: _0 Temp:    98.4 F (36.9 C)  TempSrc:    Oral  SpO2: 97% 95% 98% 96%  Weight:      Height:        Physical Exam Vitals and nursing note reviewed.  Constitutional:      Appearance: Normal appearance.  HENT:     Head: Normocephalic and atraumatic.  Eyes:     Conjunctiva/sclera: Conjunctivae normal.  Cardiovascular:     Rate and Rhythm: Normal rate and regular rhythm.  Pulmonary:  Effort: Pulmonary effort is normal.     Breath sounds: Normal breath sounds.  Abdominal:     General: Abdomen is flat.     Palpations: Abdomen is soft.  Musculoskeletal:        General: No swelling or tenderness.  Skin:    Coloration: Skin is not jaundiced or pale.  Neurological:     Mental Status: He is alert. Mental status is at baseline.       The results of significant diagnostics from this hospitalization (including imaging, microbiology, ancillary and laboratory) are listed below for reference.     Microbiology: Recent Results (from the past 240 hour(s))  Blood Culture (routine x 2)     Status: None (Preliminary result)   Collection Time: 09/17/19  9:38 PM   Specimen: BLOOD  Result Value Ref Range Status   Specimen Description BLOOD RIGHT ANTECUBITAL  Final   Special Requests   Final    BOTTLES DRAWN AEROBIC AND ANAEROBIC Blood Culture adequate volume   Culture   Final    NO GROWTH 2 DAYS Performed at [facility], [address] [place], [place] [address]    Report Status PENDING  Incomplete  Blood Culture (routine x 2)     Status: None (Preliminary result)   Collection Time: 09/17/19  9:40 PM   Specimen: BLOOD  Result Value Ref Range Status   Specimen Description BLOOD  Final   Special Requests NONE  Final   Culture   Final    NO GROWTH 2 DAYS Performed at  [facility], [address], [place], [place] [address]    Report Status PENDING  Incomplete  Urine culture     Status: None   Collection Time: 09/17/19 10:00 PM   Specimen: In/Out Cath Urine  Result Value Ref Range Status   Specimen Description   Final    IN/OUT CATH URINE Performed at [facility], [address], [place], [place] [address]    Special Requests   Final    NONE Performed at [facility], [address], [place], [place] [address]    Culture   Final    NO GROWTH Performed at [facility], [place] [address], [place],  [address]    Report Status 09/19/2019 FINAL  Final  SARS Coronavirus 2 by RT PCR (hospital order, performed in [facility] hospital lab) Nasopharyngeal Nasopharyngeal Swab     Status: None   Collection Time: 09/17/19 10:00 PM   Specimen: Nasopharyngeal Swab  Result Value Ref Range Status   SARS Coronavirus 2 NEGATIVE NEGATIVE Final    Comment: (NOTE) SARS-CoV-2 target nucleic acids are NOT DETECTED.  The SARS-CoV-2 RNA is generally detectable in upper and lower respiratory specimens during the acute phase of infection. The lowest concentration of SARS-CoV-2 viral copies this assay can detect is 250 copies / mL. A negative result does not preclude SARS-CoV-2 infection and should not be used as the sole basis for treatment or other patient management decisions.  A negative result may occur with improper specimen collection / handling, submission of specimen other than nasopharyngeal swab, presence of viral mutation(s) within the areas targeted by this assay, and inadequate number of viral copies (<250 copies / mL). A negative result must be combined with clinical observations, patient history, and epidemiological information.  Fact Sheet for Patients:   StrictlyIdeas.no  Fact Sheet for Healthcare Providers: BankingDealers.co.za  This test is not yet approved  or  cleared by the [place]  FDA and has been authorized for detection and/or diagnosis of SARS-CoV-2 by FDA under an Emergency Use Authorization (EUA).  This EUA will remain in effect (meaning this test can be used) for the duration of the COVID-19 declaration under Section 564(b)(1) of the Act, 21 U.S.C. section 360bbb-3(b)(1), unless the authorization is terminated or revoked sooner.  Performed at [facility], [address] [place], [place] [address]   MRSA PCR Screening     Status: Abnormal   Collection Time: 09/18/19  1:14 AM   Specimen: Nasopharyngeal  Result Value Ref Range Status   MRSA by PCR POSITIVE (A) NEGATIVE Final    Comment:        The GeneXpert MRSA Assay (FDA approved for NASAL specimens only), is one component of a comprehensive MRSA colonization surveillance program. It is not intended to diagnose MRSA infection nor to guide or monitor treatment for MRSA infections. RESULT CALLED TO, READ BACK BY AND VERIFIED WITH: HIRAL PATEL _0  09/18/2019 TTG Performed at [facility], [address] [place], [place] [address]      Labs: BNP (last 3 results) No results for input(s): BNP in the last 8760 hours. Basic Metabolic Panel: Recent Labs  Lab 09/17/19 2113 09/18/19 0143 09/19/19 0519  NA 139 140 138  K 3.7 3.6 3.5  CL 101 106 104  CO2 21* 22 27  GLUCOSE 160* 124* 100*  BUN _1 CREATININE 1.86* 1.35* 0.98  CALCIUM 9.3 8.2* 8.0*  MG  --  2.4 2.2  PHOS  --  5.2* 3.1   Liver Function Tests: Recent Labs  Lab 09/17/19 2113 09/18/19 0143 09/19/19 0519  AST 99* 115*  --   ALT 29 29  --   ALKPHOS 49 39  --   BILITOT 2.2* 2.0*  --   PROT 7.7 6.1*  --   ALBUMIN 4.4 3.6 3.3*   No results for input(s): LIPASE, AMYLASE in the last 168 hours. No results for input(s): AMMONIA in the last 168 hours. CBC: Recent Labs  Lab 09/17/19 2113 09/18/19 0143 09/19/19 0519  WBC 20.3* 15.1* 9.7  NEUTROABS 17.7*  --   --   HGB  13.6 11.5* 11.4*  HCT 39.1 32.2* 33.4*  MCV 86.3 84.7 88.8  PLT 291 216 180   Cardiac Enzymes: Recent Labs  Lab 09/17/19 2113 09/18/19 0143 09/18/19 1219  CKTOTAL 5,226* 7,185* 7,784*   BNP: Invalid input(s): POCBNP CBG: Recent Labs  Lab 09/18/19 0112  GLUCAP 124*   D-Dimer No results for input(s): DDIMER in the last 72 hours. Hgb A1c No results for input(s): HGBA1C in the last 72 hours. Lipid Profile No results for input(s): CHOL, HDL, LDLCALC, TRIG, CHOLHDL, LDLDIRECT in the last 72 hours. Thyroid function studies No results for input(s): TSH, T4TOTAL, T3FREE, THYROIDAB in the last 72 hours.  Invalid input(s): FREET3 Anemia work up No results for input(s): VITAMINB12, FOLATE, FERRITIN, TIBC, IRON, RETICCTPCT in the last 72 hours. Urinalysis    Component Value Date/Time   COLORURINE YELLOW (A) 09/17/2019 2319   APPEARANCEUR TURBID (A) 09/17/2019 2319   LABSPEC 1.027 09/17/2019 2319   PHURINE 5.0 09/17/2019 2319   GLUCOSEU 150 (A) 09/17/2019 2319   HGBUR LARGE (A) 09/17/2019 2319   BILIRUBINUR NEGATIVE 09/17/2019 2319   [place] 09/17/2019 2319   PROTEINUR 100 (A) 09/17/2019 2319   NITRITE NEGATIVE 09/17/2019 2319   LEUKOCYTESUR NEGATIVE 09/17/2019 2319   Sepsis Labs Invalid input(s): PROCALCITONIN,  WBC,  LACTICIDVEN Microbiology Recent Results (  from the past 240 hour(s))  Blood Culture (routine x 2)     Status: None (Preliminary result)   Collection Time: 09/17/19  9:38 PM   Specimen: BLOOD  Result Value Ref Range Status   Specimen Description BLOOD RIGHT ANTECUBITAL  Final   Special Requests   Final    BOTTLES DRAWN AEROBIC AND ANAEROBIC Blood Culture adequate volume   Culture   Final    NO GROWTH 2 DAYS Performed at [facility], [address], [place], [place] [address]    Report Status PENDING  Incomplete  Blood Culture (routine x 2)     Status: None (Preliminary result)   Collection Time: 09/17/19  9:40 PM   Specimen: BLOOD   Result Value Ref Range Status   Specimen Description BLOOD  Final   Special Requests NONE  Final   Culture   Final    NO GROWTH 2 DAYS Performed at [facility], [address] [place], [place] [address]    Report Status PENDING  Incomplete  Urine culture     Status: None   Collection Time: 09/17/19 10:00 PM   Specimen: In/Out Cath Urine  Result Value Ref Range Status   Specimen Description   Final    IN/OUT CATH URINE Performed at [facility], [address] [place], [place] [address]    Special Requests   Final    NONE Performed at [facility], [address], [place], [place] [address]    Culture   Final    NO GROWTH Performed at [facility], [place] [address], [place], [place] [address]    Report Status 09/19/2019 FINAL  Final  SARS Coronavirus 2 by RT PCR (hospital order, performed in [facility] hospital lab) Nasopharyngeal Nasopharyngeal Swab     Status: None   Collection Time: 09/17/19 10:00 PM   Specimen: Nasopharyngeal Swab  Result Value Ref Range Status   SARS Coronavirus 2 NEGATIVE NEGATIVE Final    Comment: (NOTE) SARS-CoV-2 target nucleic acids are NOT DETECTED.  The SARS-CoV-2 RNA is generally detectable in upper and lower respiratory specimens during the acute phase of infection. The lowest concentration of SARS-CoV-2 viral copies this assay can detect is 250 copies / mL. A negative result does not preclude SARS-CoV-2 infection and should not be used as the sole basis for treatment or other patient management decisions.  A negative result may occur with improper specimen collection / handling, submission of specimen other than nasopharyngeal swab, presence of viral mutation(s) within the areas targeted by this assay, and inadequate number of viral copies (<250 copies / mL). A negative result must be combined with clinical observations, patient history, and epidemiological information.  Fact Sheet for Patients:    StrictlyIdeas.no  Fact Sheet for Healthcare Providers: BankingDealers.co.za  This test is not yet approved or  cleared by the [place] FDA and has been authorized for detection and/or diagnosis of SARS-CoV-2 by FDA under an Emergency Use Authorization (EUA).  This EUA will remain in effect (meaning this test can be used) for the duration of the COVID-19 declaration under Section 564(b)(1) of the Act, 21 U.S.C. section 360bbb-3(b)(1), unless the authorization is terminated or revoked sooner.  Performed at [facility], [address] [place],  [address]   MRSA PCR Screening     Status: Abnormal   Collection Time: 09/18/19  1:14 AM   Specimen: Nasopharyngeal  Result Value Ref Range Status   MRSA by PCR POSITIVE (A)  NEGATIVE Final    Comment:        The GeneXpert MRSA Assay (FDA approved for NASAL specimens only), is one component of a comprehensive MRSA colonization surveillance program. It is not intended to diagnose MRSA infection nor to guide or monitor treatment for MRSA infections. RESULT CALLED TO, READ BACK BY AND VERIFIED WITH: HIRAL PATEL _0  09/18/2019 TTG Performed at [facility], [address], [place], [place] [address]     Discharge Instructions     Discharge Instructions    Diet - low sodium heart healthy   Complete by: As directed    Discharge instructions   Complete by: As directed    - Take Augmentin twice daily starting tomorrow for the next three days -Please follow-up with rehab, you were given resources prior to discharge -Do not use illegally obtained substances as this can lead to death If you have any significant change or worsening of your symptoms, do not hesitate to contact your primary care physician or return to the ED.   Increase activity slowly   Complete by: As directed      Allergies as of 09/19/2019      Reactions   Tramadol Nausea And Vomiting,  Other (See Comments)   Dizziness      Medication List    TAKE these medications   acetaminophen 500 MG tablet Commonly known as: TYLENOL Take 1,000 mg by mouth every 6 (six) hours as needed for mild pain or moderate pain.   amoxicillin-clavulanate 875-125 MG tablet Commonly known as: Augmentin Take 1 tablet by mouth 2 (two) times daily for 3 days. Start taking on: September 20, 2019   cetirizine 10 MG tablet Commonly known as: ZYRTEC Take 10 mg by mouth daily.   naloxone 4 MG/0.1ML Liqd nasal spray kit Commonly known as: NARCAN Use as needed for opiate overdose What changed:   how much to take  how to take this  when to take this  additional instructions   naproxen sodium 220 MG tablet Commonly known as: ALEVE Take 220 mg by mouth.       Allergies  Allergen Reactions  . Tramadol Nausea And Vomiting and Other (See Comments)    Dizziness    Dispo: The patient is from: Home              Anticipated d/c is to: Home              Anticipated d/c date WU:[id number]              Patient currently is medically stable to d/c.       Time coordinating discharge: Over 30 minutes   SIGNED:   Harold Hedge, D.O. [facility] Pager: [phone]  09/19/2019, 1:59 PM

## 2019-09-19 NOTE — Evaluation (Signed)
Physical Therapy Evaluation Patient Details Name: Marc Marks MRN: [id number] DOB: 03-01-1976 Today's Date: 09/19/2019   History of Present Illness  presented to ER due to acute agitation (requiring IM versed and haldol); admitted for management of acute hypoxic respiratory failure related to polysubstance abuse/overdose (UDS positive for benzos, cocaine, cannabis), requiring intubation 7/9-7/10 for airway protection  Clinical Impression  Upon evaluation, patient alert and oriented to basic information; follows commands and agrees to participation with session (as a means to progress towards discharge), but generally disinterested with session/education.  Bilat UE/LE strength and ROM grossly symmetrical and WFL; no focal weakness appreciated. Able to complete bed mobility, sit/stand, basic transfers and gait (175') without assist device, mod indep/indep.  Demonstrates reciprocal stepping pattern; fair cadence and gait mechanics.  Occasional sway with dynamic gait components, but self-corrects with LE step strategy. Appears to be near baseline level of functional ability, repeatedly insisting "I'm fine".  No acute skilled PT needs identified at this time. Will complete initial orders; please re-consult should needs change.    Follow Up Recommendations No PT follow up    Equipment Recommendations       Recommendations for Other Services       Precautions / Restrictions Precautions Precautions: None Restrictions Weight Bearing Restrictions: No      Mobility  Bed Mobility Overal bed mobility: Independent                Transfers Overall transfer level: Independent Equipment used: None             General transfer comment: good LE strength/control  Ambulation/Gait Ambulation/Gait assistance: Modified independent (Device/Increase time) Gait Distance (Feet): 175 Feet Assistive device: None       General Gait Details: reciprocal stepping pattern; fair cadence and gait  mechanics.  Occasional sway with dynamic gait components, but self-corrects with LE step strategy.  Stairs            Wheelchair Mobility    Modified Rankin (Stroke Patients Only)       Balance Overall balance assessment: Modified Independent                                           Pertinent Vitals/Pain Pain Assessment: No/denies pain    Home Living Family/patient expects to be discharged to:: Private residence Living Arrangements: Parent Available Help at Discharge: Family Type of Home: House Home Access: Level entry     Home Layout: One level        Prior Function Level of Independence: Independent         Comments: Indep with ADLs, household and community mobilization; + driving; works in [employer]        Extremity/Trunk Assessment   Upper Extremity Assessment Upper Extremity Assessment: Overall WFL for tasks assessed    Lower Extremity Assessment Lower Extremity Assessment: Overall WFL for tasks assessed       Communication   Communication: No difficulties  Cognition Arousal/Alertness: Awake/alert Behavior During Therapy: Flat affect (generally disinterested in session) Overall Cognitive Status: Within Functional Limits for tasks assessed                                        General Comments      Exercises  Assessment/Plan    PT Assessment Patent does not need any further PT services  PT Problem List         PT Treatment Interventions      PT Goals (Current goals can be found in the Care Plan section)  Acute Rehab PT Goals Patient Stated Goal: to go home today PT Goal Formulation: All assessment and education complete, DC therapy Time For Goal Achievement: 09/19/19 Potential to Achieve Goals: Good    Frequency     Barriers to discharge        Co-evaluation               AM-PAC PT "6 Clicks" Mobility  Outcome Measure Help needed turning from  your back to your side while in a flat bed without using bedrails?: None Help needed moving from lying on your back to sitting on the side of a flat bed without using bedrails?: None Help needed moving to and from a bed to a chair (including a wheelchair)?: None Help needed standing up from a chair using your arms (e.g., wheelchair or bedside chair)?: None Help needed to walk in hospital room?: None Help needed climbing 3-5 steps with a railing? : None 6 Click Score: 24    End of Session   Activity Tolerance: Patient tolerated treatment well Patient left: in bed;with call bell/phone within reach;with bed alarm set Nurse Communication: Mobility status PT Visit Diagnosis: Muscle weakness (generalized) (M62.81)    Time: 5009-3818 PT Time Calculation (min) (ACUTE ONLY): 10 min   Charges:   PT Evaluation $PT Eval Low Complexity: 1 Low          Marc Marks, PT, DPT, NCS 09/19/19, 12:55 PM [phone]

## 2019-09-22 LAB — CULTURE, BLOOD (ROUTINE X 2)
Culture: NO GROWTH
Culture: NO GROWTH
Special Requests: ADEQUATE

## 2019-09-28 ENCOUNTER — Telehealth: Payer: Self-pay | Admitting: General Practice

## 2019-09-28 NOTE — Telephone Encounter (Signed)
Individual has been contacted regarding ED referral and has been given information regarding the clinic. Each time the individual was called a message was left with his mother. No further attempts to contact individual will be made.

## 2019-10-09 ENCOUNTER — Emergency Department
Admission: EM | Admit: 2019-10-09 | Discharge: 2019-10-10 | Disposition: A | Discharge status: Home / Self Care | Payer: Self-pay | Attending: Emergency Medicine | Admitting: Emergency Medicine

## 2019-10-09 ENCOUNTER — Emergency Department: Payer: Self-pay

## 2019-10-09 ENCOUNTER — Other Ambulatory Visit: Payer: Self-pay

## 2019-10-09 DIAGNOSIS — E119 Type 2 diabetes mellitus without complications: Secondary | ICD-10-CM | POA: Insufficient documentation

## 2019-10-09 DIAGNOSIS — Z0279 Encounter for issue of other medical certificate: Secondary | ICD-10-CM | POA: Insufficient documentation

## 2019-10-09 DIAGNOSIS — F1590 Other stimulant use, unspecified, uncomplicated: Secondary | ICD-10-CM | POA: Insufficient documentation

## 2019-10-09 DIAGNOSIS — F17200 Nicotine dependence, unspecified, uncomplicated: Secondary | ICD-10-CM | POA: Insufficient documentation

## 2019-10-09 DIAGNOSIS — F1910 Other psychoactive substance abuse, uncomplicated: Secondary | ICD-10-CM

## 2019-10-09 DIAGNOSIS — R451 Restlessness and agitation: Secondary | ICD-10-CM | POA: Insufficient documentation

## 2019-10-09 DIAGNOSIS — E876 Hypokalemia: Secondary | ICD-10-CM | POA: Insufficient documentation

## 2019-10-09 DIAGNOSIS — I10 Essential (primary) hypertension: Secondary | ICD-10-CM | POA: Insufficient documentation

## 2019-10-09 LAB — URINE DRUG SCREEN, QUALITATIVE (ARMC ONLY)
Amphetamines, Ur Screen: NOT DETECTED
Barbiturates, Ur Screen: NOT DETECTED
Benzodiazepine, Ur Scrn: NOT DETECTED
Cannabinoid 50 Ng, Ur ~~LOC~~: NOT DETECTED
Cocaine Metabolite,Ur ~~LOC~~: POSITIVE — AB
MDMA (Ecstasy)Ur Screen: NOT DETECTED
Methadone Scn, Ur: NOT DETECTED
Opiate, Ur Screen: NOT DETECTED
Phencyclidine (PCP) Ur S: NOT DETECTED
Tricyclic, Ur Screen: NOT DETECTED

## 2019-10-09 LAB — COMPREHENSIVE METABOLIC PANEL
ALT: 18 U/L (ref 0–44)
AST: 31 U/L (ref 15–41)
Albumin: 4.7 g/dL (ref 3.5–5.0)
Alkaline Phosphatase: 56 U/L (ref 38–126)
Anion gap: 15 (ref 5–15)
BUN: 12 mg/dL (ref 6–20)
CO2: 26 mmol/L (ref 22–32)
Calcium: 9.3 mg/dL (ref 8.9–10.3)
Chloride: 107 mmol/L (ref 98–111)
Creatinine, Ser: 1.43 mg/dL — ABNORMAL HIGH (ref 0.61–1.24)
GFR calc Af Amer: >60 mL/min (ref >60)
GFR calc non Af Amer: 60 mL/min — ABNORMAL LOW (ref >60)
Glucose, Bld: 121 mg/dL — ABNORMAL HIGH (ref 70–99)
Potassium: 3 mmol/L — ABNORMAL LOW (ref 3.5–5.1)
Sodium: 148 mmol/L — ABNORMAL HIGH (ref 135–145)
Total Bilirubin: 2.2 mg/dL — ABNORMAL HIGH (ref 0.3–1.2)
Total Protein: 8.1 g/dL (ref 6.5–8.1)

## 2019-10-09 LAB — CBC
HCT: 39.4 % (ref 39.0–52.0)
Hemoglobin: 13.5 g/dL (ref 13.0–17.0)
MCH: 30.1 pg (ref 26.0–34.0)
MCHC: 34.3 g/dL (ref 30.0–36.0)
MCV: 87.9 fL (ref 80.0–100.0)
Platelets: 303 10*3/uL (ref 150–400)
RBC: 4.48 MIL/uL (ref 4.22–5.81)
RDW: 13.6 % (ref 11.5–15.5)
WBC: 18.9 10*3/uL — ABNORMAL HIGH (ref 4.0–10.5)
nRBC: 0 % (ref 0.0–0.2)

## 2019-10-09 LAB — SALICYLATE LEVEL: Salicylate Lvl: <7 mg/dL — ABNORMAL LOW (ref 7.0–30.0)

## 2019-10-09 LAB — ACETAMINOPHEN LEVEL: Acetaminophen (Tylenol), Serum: <10 ug/mL — ABNORMAL LOW (ref 10–30)

## 2019-10-09 LAB — ETHANOL: Alcohol, Ethyl (B): <10 mg/dL (ref <10)

## 2019-10-09 MED ORDER — LORAZEPAM 2 MG/ML IJ SOLN
1.0000 mg | Freq: Once | INTRAMUSCULAR | Status: AC
  Administered 2019-10-09: 1 mg via INTRAVENOUS
  Filled 2019-10-09: qty 1

## 2019-10-09 MED ORDER — SODIUM CHLORIDE 0.9 % IV BOLUS
1000.0000 mL | Freq: Once | INTRAVENOUS | Status: AC
  Administered 2019-10-09: 1000 mL via INTRAVENOUS

## 2019-10-09 MED ORDER — HALOPERIDOL LACTATE 5 MG/ML IJ SOLN: 5.0000 mg | Freq: Four times a day (QID) | INTRAMUSCULAR | Status: DC | PRN

## 2019-10-09 MED ORDER — POTASSIUM CHLORIDE CRYS ER 20 MEQ PO TBCR
40.0000 meq | EXTENDED_RELEASE_TABLET | Freq: Once | ORAL | Status: AC
  Administered 2019-10-09: 40 meq via ORAL
  Filled 2019-10-09: qty 2

## 2019-10-09 MED ORDER — HALOPERIDOL 0.5 MG PO TABS
0.5000 mg | ORAL_TABLET | Freq: Four times a day (QID) | ORAL | Status: DC | PRN
  Filled 2019-10-09: qty 1

## 2019-10-09 NOTE — ED Notes (Signed)
Pt pants and underwear removed at this time and placed in pt belongings bag. Placed in blue scrub pants.

## 2019-10-09 NOTE — ED Notes (Signed)
Pt meal tray sat at bedside 

## 2019-10-09 NOTE — ED Notes (Signed)
Pt awake at this time asking for something to eat and drink. Same given.

## 2019-10-09 NOTE — ED Notes (Signed)
Pt provided cup of water per request.

## 2019-10-09 NOTE — ED Notes (Addendum)
Pt with sheriffs. Pt states he drank some beers and smoked a joint. Police state they found pt yelling in a yard. Pt states he is thirsty. Pt on cardiac monitor. Pt noted to be diaphoretic

## 2019-10-09 NOTE — ED Provider Notes (Signed)
[facility] Emergency Department Provider Note   ____________________________________________   First MD Initiated Contact with Patient 10/09/19 0315     (approximate)  I have reviewed the triage vital signs and the nursing notes.   HISTORY  Chief Complaint Medical Clearance  Level V caveat: Limited by agitation  HPI Marc Marks is a 44 y.o. male brought to the ED with Sheriff's deputy under IVC for methamphetamine abuse, erratic behavior and walking in traffic.  Patient admits to beer and marijuana.  Reportedly his mother told the officers that patient also has a history of methamphetamine and cocaine abuse.  Patient only complains of thirst.  Arrives to the ED agitated.  Rest of history unobtainable secondary to patient's agitation.  There is no reported history of trauma.       Past Medical History:  Diagnosis Date  . Diabetes mellitus without complication (HCC)   . Hypertension   . Polysubstance abuse [facility])     Patient Active Problem List   Diagnosis Date Noted  . Delirium   . Sepsis with acute respiratory failure (HCC)   . Elevated troponin   . Acute respiratory failure (HCC) 07/31/2019  . Drug overdose   . Polysubstance abuse (HCC)     No past surgical history on file.  Prior to Admission medications   Medication Sig Start Date End Date Taking? Authorizing Provider  acetaminophen (TYLENOL) 500 MG tablet Take 1,000 mg by mouth every 6 (six) hours as needed for mild pain or moderate pain.    [provider]  cetirizine (ZYRTEC) 10 MG tablet Take 10 mg by mouth daily.    [provider]  naloxone [facility]) nasal spray 4 mg/0.1 mL Use as needed for opiate overdose Patient taking differently: Place 0.4 mg into the nose See admin instructions. Take 1 spray (0.4 mg totally) as needed for opiate overdose 04/17/19   Miguel Aschoff., MD  naproxen sodium (ALEVE) 220 MG tablet Take 220 mg by mouth.    [provider]     Allergies Tramadol  No family history on file.  Social History Social History   Tobacco Use  . Smoking status: Current Every Day Smoker    Packs/day: 0.25  . Smokeless tobacco: Current User    Types: Snuff  Substance Use Topics  . Alcohol use: Yes  . Drug use: Yes    Types: Marijuana    Comment: percoset    Review of Systems  Constitutional: No fever/chills Eyes: No visual changes. ENT: No sore throat. Cardiovascular: Denies chest pain. Respiratory: Denies shortness of breath. Gastrointestinal: No abdominal pain.  No nausea, no vomiting.  No diarrhea.  No constipation. Genitourinary: Negative for dysuria. Musculoskeletal: Negative for back pain. Skin: Negative for rash. Neurological: Negative for headaches, focal weakness or numbness. Psychiatric:  Positive for agitation.  ____________________________________________   PHYSICAL EXAM:  VITAL SIGNS: ED Triage Vitals  Enc Vitals Group     BP 10/09/19 0307 (!) 124/93     Pulse Rate 10/09/19 0307 (!) 119     Resp 10/09/19 0307 (!) 26     Temp 10/09/19 0312 99.3 F (37.4 C)     Temp src --      SpO2 10/09/19 0307 93 %     Weight 10/09/19 0308 200 lb (90.7 kg)     Height 10/09/19 0308 5\' 10"  (1.778 m)     Head Circumference --      Peak Flow --      Pain Score  10/09/19 0308 10     Pain Loc --      Pain Edu? --      Excl. in GC? --     Constitutional: Alert and oriented.  Disheveled appearing and in mild acute distress. Eyes: Conjunctivae are normal. PERRL. EOMI. Head: Atraumatic. Nose: Atraumatic. Mouth/Throat: Mucous membranes are mildly dry.  No dental malocclusion.  Poor dentition. Neck: No stridor.  No cervical spine tenderness to palpation. Cardiovascular: Tachycardic rate, regular rhythm. Grossly normal heart sounds.  Good peripheral circulation. Respiratory: Normal respiratory effort.  No retractions. Lungs CTAB. Gastrointestinal: Soft and nontender. No distention. No abdominal bruits. No CVA  tenderness. Musculoskeletal: No lower extremity tenderness nor edema.  No joint effusions. Neurologic: Alert and oriented x3.  Normal speech and language. No gross focal neurologic deficits are appreciated. MAEx4. Skin:  Skin is warm, dry and intact. No rash noted.  No petechiae. Psychiatric: Mood and affect are agitated. Speech and behavior are normal.  ____________________________________________   LABS (all labs ordered are listed, but only abnormal results are displayed)  Labs Reviewed  COMPREHENSIVE METABOLIC PANEL - Abnormal; Notable for the following components:      Result Value   Sodium 148 (*)    Potassium 3.0 (*)    Glucose, Bld 121 (*)    Creatinine, Ser 1.43 (*)    Total Bilirubin 2.2 (*)    GFR calc non Af Amer 60 (*)    All other components within normal limits  CBC - Abnormal; Notable for the following components:   WBC 18.9 (*)    All other components within normal limits  ACETAMINOPHEN LEVEL - Abnormal; Notable for the following components:   Acetaminophen (Tylenol), Serum <10 (*)    All other components within normal limits  SALICYLATE LEVEL - Abnormal; Notable for the following components:   Salicylate Lvl <7.0 (*)    All other components within normal limits  ETHANOL  URINE DRUG SCREEN, QUALITATIVE ([facility] ONLY)   ____________________________________________  EKG  ED ECG REPORT I, Uniqua Kihn J, the attending physician, personally viewed and interpreted this ECG.   Date: 10/09/2019  EKG Time: 0313  Rate: 118  Rhythm: sinus tachycardia  Axis: Normal  Intervals:none  ST&T Change: Nonspecific  ____________________________________________  RADIOLOGY  ED MD interpretation: No acute cardiopulmonary process  Official radiology report(s): DG Chest Port 1 View  Result Date: 10/09/2019 CLINICAL DATA:  PT to ED with [facility]. PT here bc people were calling about a male wakling around without shirt. Upon arrival pt is sweaty, twitchy and  erratic. PT denies ingestion of anything besides weed and beer. Per officer, pt has hx of meth and cocaine accoriding to his mother. PT is alert and oriented. PT will not hold still. EXAM: PORTABLE CHEST 1 VIEW COMPARISON:  09/17/2019 FINDINGS: The cardiac silhouette is normal in size. No mediastinal or hilar masses. No evidence of adenopathy. Lung volumes are low. Lungs are clear. No pleural effusion or pneumothorax. Skeletal structures are grossly intact. IMPRESSION: No active disease. Electronically Signed   By: Amie Portland M.D.   On: 10/09/2019 04:08    ____________________________________________   PROCEDURES  Procedure(s) performed (including Critical Care):  .1-3 Lead EKG Interpretation Performed by: Irean Hong, MD Authorized by: Irean Hong, MD     Interpretation: abnormal     ECG rate:  115   ECG rate assessment: tachycardic     Rhythm: sinus tachycardia     Ectopy: none     Conduction: normal  Comments:     Patient placed on cardiac monitor to evaluate for arrhythmias     ____________________________________________   INITIAL IMPRESSION / ASSESSMENT AND PLAN / ED COURSE  As part of my medical decision making, I reviewed the following data within the electronic MEDICAL RECORD NUMBER Nursing notes reviewed and incorporated, Labs reviewed, EKG interpreted, Old chart reviewed, Radiograph reviewed and Notes from prior ED visits     Marc Marks was evaluated in Emergency Department on 10/09/2019 for the symptoms described in the history of present illness. He was evaluated in the context of the global COVID-19 pandemic, which necessitated consideration that the patient might be at risk for infection with the SARS-CoV-2 virus that causes COVID-19. Institutional protocols and algorithms that pertain to the evaluation of patients at risk for COVID-19 are in a state of rapid change based on information released by regulatory bodies including the CDC and federal and state  organizations. These policies and algorithms were followed during the patient's care in the ED.    44 year old male brought in under IVC for erratic behavior; history of methamphetamine and cocaine use. Differential diagnosis includes, but is not limited to, alcohol, illicit or prescription medications, or other toxic ingestion; intracranial pathology such as stroke or intracerebral hemorrhage; fever or infectious causes including sepsis; hypoxemia and/or hypercarbia; uremia; trauma; endocrine related disorders such as diabetes, hypoglycemia, and thyroid-related diseases; hypertensive encephalopathy; etc.   Will obtain toxicological work-up, chest x-ray.  Initiate IV fluid resuscitation, IV Ativan and reassess.   Clinical Course as of Oct 08 656  Sat Oct 09, 2019  0623 Patient cooperative with taking potassium tablets.  Stable bilirubin from previous.  At this time we will move to the behavioral medicine quadrant and obtain psychiatric evaluation. The patient has been placed in psychiatric observation due to the need to provide a safe environment for the patient while obtaining psychiatric consultation and evaluation, as well as ongoing medical and medication management to treat the patient's condition. The patient has been placed under full IVC at this time.     [JS]    Clinical Course User Index [JS] Irean Hong, MD     ____________________________________________   FINAL CLINICAL IMPRESSION(S) / ED DIAGNOSES  Final diagnoses:  Hypokalemia  Agitation     ED Discharge Orders    None       Note:  This document was prepared using Dragon voice recognition software and may include unintentional dictation errors.   Irean Hong, MD 10/09/19 [id number]

## 2019-10-09 NOTE — ED Notes (Signed)
Charge RN notified pt is an IVC pt and pt needs a Comptroller. Emergency planning/management officer at bedside.

## 2019-10-09 NOTE — ED Notes (Signed)
RN went to change pt out. Pt kept jerking and twitching body. Forensic restraints to both wrists still in place. Provider stated pt can get Ativan and then reacess and move to the quad after that. Consulting civil engineer notified.

## 2019-10-09 NOTE — ED Triage Notes (Signed)
PT to ED with [facility]. PT here bc people were calling about a male wakling around without shirt. Upon arrival pt is sweaty, twitchy and erratic. PT denies ingestion of anything besides weed and beer.  Per officer, pt has hx of meth and cocaine accoriding to his mother. PT is alert and oriented. PT will not hold still.

## 2019-10-09 NOTE — ED Notes (Signed)
Forensic restrains removed and pt remaining calm. Moved to 19H. Given warm blankets.

## 2019-10-09 NOTE — ED Notes (Signed)
Restraints removed at this time. Shirt placed on pt at this time. Pt cooperative with shirt. Will continue to monitor pt. Provided warm blanket.

## 2019-10-09 NOTE — ED Notes (Signed)
Pt keeps moving in bed and will not keep monitor on. Pt has gotten cardiac monitor off. RN attempted to put pulse ox on. Pt took it off.

## 2019-10-09 NOTE — Consult Note (Addendum)
[facility] Face-to-Face Psychiatry Consult   Reason for Consult:  Psych eval  Referring Physician:  Dr. Derrill Kay Patient Identification: Marc Marks MRN:  [id number] Principal Diagnosis: <principal problem not specified> Diagnosis:  Active Problems:   * No active hospital problems. * Substance induced mood disorder  Total Time spent with patient: 45 minutes   HPI:   Patient was brought to the emergency department by law enforcement under involuntary commitment due to erratic behaviors and walking in traffic.  At the time, patient had reported drinking beer and using marijuana.  His mother had relayed to the officers that he has a history of methamphetamine cannabis use.  He is known to be agitated in the emergency department.  For his safety as well as safety as others, he was placed in restraints.  He presented to the emergency department due to an acute overdose and acute hypoxic respiratory failure.  He was intubated at the time and admitted to the medical floor.  Was noted to be severely agitated necessitating IM Versed and Haldol.  He also needed to be restrained. Urine drug screen at this time is currently pending, needs to be collected according to the EMR.  He is negative for alcohol.  Examination, patient was seen sleeping in his bed.  He was restless in the bed, did not engage in the interview.  Unable to ascertain mood, irritability or if he was suicidal.  Emergency planning/management officer is at bedside.  Update: We did get collateral information from his mother who states that he has been struggling with erratic behaviors for the past 2 and half years.  She does not know what drugs he used.  His urine drug screen was positive for cocaine.  He has had multiple ER visits recently under similar circumstances.  Says that he is pretty quiet and stays in the room.  No anger or aggression off of drugs but when he uses he becomes very aggressive, screams and also punches holes in the wall.  He is eating well for the  most part.  Unsure if he is depressed or hearing or seeing things.  He does go to work.  Past Psychiatric History: Unknown  Risk to Self:   Risk to Others:   Prior Inpatient Therapy:   Prior Outpatient Therapy:    Past Medical History:  Past Medical History:  Diagnosis Date  . Diabetes mellitus without complication (HCC)   . Hypertension   . Polysubstance abuse (HCC)    No past surgical history on file. Family History: No family history on file. Family Psychiatric  History: Unknown Social History:  Social History   Substance and Sexual Activity  Alcohol Use Yes     Social History   Substance and Sexual Activity  Drug Use Yes  . Types: Marijuana   Comment: percoset    Social History   Socioeconomic History  . Marital status: Legally Separated    Spouse name: Not on file  . Number of children: Not on file  . Years of education: Not on file  . Highest education level: Not on file  Occupational History  . Not on file  Tobacco Use  . Smoking status: Current Every Day Smoker    Packs/day: 0.25  . Smokeless tobacco: Current User    Types: Snuff  Substance and Sexual Activity  . Alcohol use: Yes  . Drug use: Yes    Types: Marijuana    Comment: percoset  . Sexual activity: Not on file  Other Topics Concern  .  Not on file  Social History Narrative  . Not on file   Social Determinants of Health   Financial Resource Strain:   . Difficulty of Paying Living Expenses:   Food Insecurity:   . Worried About Programme researcher, broadcasting/film/video in the Last Year:   . Barista in the Last Year:   Transportation Needs:   . Freight forwarder (Medical):   Marland Kitchen Lack of Transportation (Non-Medical):   Physical Activity:   . Days of Exercise per Week:   . Minutes of Exercise per Session:   Stress:   . Feeling of Stress :   Social Connections:   . Frequency of Communication with Friends and Family:   . Frequency of Social Gatherings with Friends and Family:   . Attends  Religious Services:   . Active Member of Clubs or Organizations:   . Attends Banker Meetings:   Marland Kitchen Marital Status:    Additional Social History:    Allergies:   Allergies  Allergen Reactions  . Tramadol Nausea And Vomiting and Other (See Comments)    Dizziness    Labs:  Results for orders placed or performed during the hospital encounter of 10/09/19 (from the past 48 hour(s))  Comprehensive metabolic panel     Status: Abnormal   Collection Time: 10/09/19  3:21 AM  Result Value Ref Range   Sodium 148 (H) 135 - 145 mmol/L   Potassium 3.0 (L) 3.5 - 5.1 mmol/L   Chloride 107 98 - 111 mmol/L   CO2 26 22 - 32 mmol/L   Glucose, Bld 121 (H) 70 - 99 mg/dL    Comment: Glucose reference range applies only to samples taken after fasting for at least 8 hours.   BUN 12 6 - 20 mg/dL   Creatinine, Ser 6.81 (H) 0.61 - 1.24 mg/dL   Calcium 9.3 8.9 - 15.7 mg/dL   Total Protein 8.1 6.5 - 8.1 g/dL   Albumin 4.7 3.5 - 5.0 g/dL   AST 31 15 - 41 U/L   ALT 18 0 - 44 U/L   Alkaline Phosphatase 56 38 - 126 U/L   Total Bilirubin 2.2 (H) 0.3 - 1.2 mg/dL   GFR calc non Af Amer 60 (L) >60 mL/min   GFR calc Af Amer >60 >60 mL/min   Anion gap 15 5 - 15    Comment: Performed at [facility], [address] [place], [place] [address]  Ethanol     Status: None   Collection Time: 10/09/19  3:21 AM  Result Value Ref Range   Alcohol, Ethyl (B) <10 <10 mg/dL    Comment: (NOTE) Lowest detectable limit for serum alcohol is 10 mg/dL.  For medical purposes only. Performed at [facility], [address] [place], [place] [address]   cbc     Status: Abnormal   Collection Time: 10/09/19  3:21 AM  Result Value Ref Range   WBC 18.9 (H) 4.0 - 10.5 K/uL   RBC 4.48 4.22 - 5.81 MIL/uL   Hemoglobin 13.5 13.0 - 17.0 g/dL   HCT 16.3 39 - 52 %   MCV 87.9 80.0 - 100.0 fL   MCH 30.1 26.0 - 34.0 pg   MCHC 34.3 30.0 - 36.0 g/dL   RDW 84.5 36.4 - 68.0 %   Platelets 303 150 - 400  K/uL   nRBC 0.0 0.0 - 0.2 %    Comment: Performed at [facility], [address], [place], [place]  [address]  Acetaminophen level     Status: Abnormal   Collection Time: 10/09/19  3:21 AM  Result Value Ref Range   Acetaminophen (Tylenol), Serum <10 (L) 10 - 30 ug/mL    Comment: (NOTE) Therapeutic concentrations vary significantly. A range of 10-30 ug/mL  may be an effective concentration for many patients. However, some  are best treated at concentrations outside of this range. Acetaminophen concentrations >150 ug/mL at 4 hours after ingestion  and >50 ug/mL at 12 hours after ingestion are often associated with  toxic reactions.  Performed at [facility], [address] [place], [place] [address]   Salicylate level     Status: Abnormal   Collection Time: 10/09/19  3:21 AM  Result Value Ref Range   Salicylate Lvl <7.0 (L) 7.0 - 30.0 mg/dL    Comment: Performed at [facility], [address] [place], [place] [address]    No current facility-administered medications for this encounter.   Current Outpatient Medications  Medication Sig Dispense Refill  . acetaminophen (TYLENOL) 500 MG tablet Take 1,000 mg by mouth every 6 (six) hours as needed for mild pain or moderate pain.    . cetirizine (ZYRTEC) 10 MG tablet Take 10 mg by mouth daily.    . naloxone (NARCAN) nasal spray 4 mg/0.1 mL Use as needed for opiate overdose (Patient taking differently: Place 0.4 mg into the nose See admin instructions. Take 1 spray (0.4 mg totally) as needed for opiate overdose) 1 each 0  . naproxen sodium (ALEVE) 220 MG tablet Take 220 mg by mouth.      General Appearance: Well Groomed  Eye Contact:  Good  Speech:  Negative  Volume:  Normal  Mood:  unknown  Affect:  Congruent  Thought Process:  Coherent  Orientation:  Full (Time, Place, and Person)- NOT  Thought Content:  Negative  Suicidal Thoughts:  No  Homicidal Thoughts:  No  Memory:  Negative  Judgement:   Good  Insight:  poor  Psychomotor Activity:  Normal  Concentration:  Concentration: poor  Recall:  poor  Fund of Knowledge:  poor  Language:  Good  Akathisia:  Negative  Handed:  Right  AIMS (if indicated):     Assets:  Physical Health  ADL's:  not Intact  Cognition:  decreased  Sleep:        Treatment Plan Summary:   Patient was seen and assessed by the ER emergency room doctor Unable to ascertain any information from the patient We will need to reassess tomorrow His urine drug screen is pending, will encourage him to obtain 1. Patient is noted to be on involuntary commitment Patient is on suicide precautions while emergency department. Add PO/IM Haldol for agitation  collateral information obtained from his mother  Disposition:  Pending. Reggie Pile, MD 10/09/2019 11:11 AM

## 2019-10-09 NOTE — ED Notes (Signed)
IVC paperwork given to unit secretary

## 2019-10-09 NOTE — ED Notes (Signed)
Psych at bedside.

## 2019-10-09 NOTE — ED Notes (Signed)
Pt given dinner tray. Pt still resting. 
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
## 2019-10-10 NOTE — ED Notes (Signed)
breakfast tray given

## 2019-10-10 NOTE — Consult Note (Signed)
[facility] Face-to-Face Psychiatry Consult   Reason for Consult:  Psych eval  Referring Physician:  Dr. Derrill Kay Patient Identification: Marc Marks MRN:  [id number] Principal Diagnosis: <principal problem not specified> Diagnosis:  Active Problems:   * No active hospital problems. * Substance induced mood disorder  Total Time spent with patient: 15 minutes   HPI:   Patient is alert and oriented x3 today.  He does not have a lot of information, speaks in 1 or 2 words.  Says that he is not been depressed.  In fact he indicates that has not been using any drugs and has been drinking 2 beers per day.  He denies thoughts of suicide or homicide, both today and recently.  He has been sleeping well at home.  Denies anxiety.  He does agree to history of anger when he was on heroin "for years" ago.  He denies symptoms of mania psychosis and irritability at this time. We were finally able to get a urine drug screen on him.  He was positive for cocaine.  Past Psychiatric History: Unknown  Risk to Self: Suicidal Ideation: No Suicidal Intent: No Is patient at risk for suicide?: No Suicidal Plan?: No Access to Means: No What has been your use of drugs/alcohol within the last 12 months?: Meth, Cocaine, Opiates, Marijuana, Benzos How many times?: 0 Triggers for Past Attempts: None known Intentional Self Injurious Behavior: None Risk to Others: Homicidal Ideation: No Thoughts of Harm to Others: No Current Homicidal Intent: No Current Homicidal Plan: No Access to Homicidal Means: No Identified Victim: n/a History of harm to others?: No Assessment of Violence: None Noted Violent Behavior Description: n/a Does patient have access to weapons?: No Criminal Charges Pending?: No Does patient have a court date: No Prior Inpatient Therapy: Prior Inpatient Therapy: No Prior Outpatient Therapy: Prior Outpatient Therapy: No Does patient have an ACCT team?: No Does patient have Intensive In-House Services?  :  No Does patient have [facility] services? : No Does patient have P4CC services?: No  Past Medical History:  Past Medical History:  Diagnosis Date  . Diabetes mellitus without complication (HCC)   . Hypertension   . Polysubstance abuse (HCC)    No past surgical history on file. Family History: No family history on file. Family Psychiatric  History: Unknown Social History:  Social History   Substance and Sexual Activity  Alcohol Use Yes     Social History   Substance and Sexual Activity  Drug Use Yes  . Types: Marijuana   Comment: percoset    Social History   Socioeconomic History  . Marital status: Legally Separated    Spouse name: Not on file  . Number of children: Not on file  . Years of education: Not on file  . Highest education level: Not on file  Occupational History  . Not on file  Tobacco Use  . Smoking status: Current Every Day Smoker    Packs/day: 0.25  . Smokeless tobacco: Current User    Types: Snuff  Substance and Sexual Activity  . Alcohol use: Yes  . Drug use: Yes    Types: Marijuana    Comment: percoset  . Sexual activity: Not on file  Other Topics Concern  . Not on file  Social History Narrative  . Not on file   Social Determinants of Health   Financial Resource Strain:   . Difficulty of Paying Living Expenses:   Food Insecurity:   . Worried About Programme researcher, broadcasting/film/video in the Last  Year:   . Ran Out of Food in the Last Year:   Transportation Needs:   . Freight forwarder (Medical):   Marland Kitchen Lack of Transportation (Non-Medical):   Physical Activity:   . Days of Exercise per Week:   . Minutes of Exercise per Session:   Stress:   . Feeling of Stress :   Social Connections:   . Frequency of Communication with Friends and Family:   . Frequency of Social Gatherings with Friends and Family:   . Attends Religious Services:   . Active Member of Clubs or Organizations:   . Attends Banker Meetings:   Marland Kitchen Marital Status:     Additional Social History:    Allergies:   Allergies  Allergen Reactions  . Tramadol Nausea And Vomiting and Other (See Comments)    Dizziness    Labs:  Results for orders placed or performed during the hospital encounter of 10/09/19 (from the past 48 hour(s))  Comprehensive metabolic panel     Status: Abnormal   Collection Time: 10/09/19  3:21 AM  Result Value Ref Range   Sodium 148 (H) 135 - 145 mmol/L   Potassium 3.0 (L) 3.5 - 5.1 mmol/L   Chloride 107 98 - 111 mmol/L   CO2 26 22 - 32 mmol/L   Glucose, Bld 121 (H) 70 - 99 mg/dL    Comment: Glucose reference range applies only to samples taken after fasting for at least 8 hours.   BUN 12 6 - 20 mg/dL   Creatinine, Ser 8.36 (H) 0.61 - 1.24 mg/dL   Calcium 9.3 8.9 - 62.9 mg/dL   Total Protein 8.1 6.5 - 8.1 g/dL   Albumin 4.7 3.5 - 5.0 g/dL   AST 31 15 - 41 U/L   ALT 18 0 - 44 U/L   Alkaline Phosphatase 56 38 - 126 U/L   Total Bilirubin 2.2 (H) 0.3 - 1.2 mg/dL   GFR calc non Af Amer 60 (L) >60 mL/min   GFR calc Af Amer >60 >60 mL/min   Anion gap 15 5 - 15    Comment: Performed at [facility], [address] [place], [place] [address]  Ethanol     Status: None   Collection Time: 10/09/19  3:21 AM  Result Value Ref Range   Alcohol, Ethyl (B) <10 <10 mg/dL    Comment: (NOTE) Lowest detectable limit for serum alcohol is 10 mg/dL.  For medical purposes only. Performed at [facility], [address] [place], [place] [address]   cbc     Status: Abnormal   Collection Time: 10/09/19  3:21 AM  Result Value Ref Range   WBC 18.9 (H) 4.0 - 10.5 K/uL   RBC 4.48 4.22 - 5.81 MIL/uL   Hemoglobin 13.5 13.0 - 17.0 g/dL   HCT 46.5 39 - 52 %   MCV 87.9 80.0 - 100.0 fL   MCH 30.1 26.0 - 34.0 pg   MCHC 34.3 30.0 - 36.0 g/dL   RDW 68.1 27.5 - 17.0 %   Platelets 303 150 - 400 K/uL   nRBC 0.0 0.0 - 0.2 %    Comment: Performed at [facility], [address], [place], [place] [address]   Acetaminophen level     Status: Abnormal   Collection Time: 10/09/19  3:21 AM  Result Value Ref Range   Acetaminophen (Tylenol), Serum <10 (L) 10 - 30 ug/mL    Comment: (NOTE) Therapeutic concentrations vary significantly. A range of 10-30  ug/mL  may be an effective concentration for many patients. However, some  are best treated at concentrations outside of this range. Acetaminophen concentrations >150 ug/mL at 4 hours after ingestion  and >50 ug/mL at 12 hours after ingestion are often associated with  toxic reactions.  Performed at [facility] [address] [place], [place] [address]   Salicylate level     Status: Abnormal   Collection Time: 10/09/19  3:21 AM  Result Value Ref Range   Salicylate Lvl <7.0 (L) 7.0 - 30.0 mg/dL    Comment: Performed at [facility], [address], [place], [place] [address]  Urine Drug Screen, Qualitative     Status: Abnormal   Collection Time: 10/09/19 10:53 AM  Result Value Ref Range   Tricyclic, Ur Screen NONE DETECTED NONE DETECTED   Amphetamines, Ur Screen NONE DETECTED NONE DETECTED   MDMA (Ecstasy)Ur Screen NONE DETECTED NONE DETECTED   Cocaine Metabolite,Ur [place] POSITIVE (A) NONE DETECTED   Opiate, Ur Screen NONE DETECTED NONE DETECTED   Phencyclidine (PCP) Ur S NONE DETECTED NONE DETECTED   Cannabinoid 50 Ng, Ur [place] NONE DETECTED NONE DETECTED   Barbiturates, Ur Screen NONE DETECTED NONE DETECTED   Benzodiazepine, Ur Scrn NONE DETECTED NONE DETECTED   Methadone Scn, Ur NONE DETECTED NONE DETECTED    Comment: (NOTE) Tricyclics + metabolites, urine    Cutoff 1000 ng/mL Amphetamines + metabolites, urine  Cutoff 1000 ng/mL MDMA (Ecstasy), urine              Cutoff 500 ng/mL Cocaine Metabolite, urine          Cutoff 300 ng/mL Opiate + metabolites, urine        Cutoff 300 ng/mL Phencyclidine (PCP), urine         Cutoff 25 ng/mL Cannabinoid, urine                 Cutoff 50 ng/mL Barbiturates + metabolites, urine   Cutoff 200 ng/mL Benzodiazepine, urine              Cutoff 200 ng/mL Methadone, urine                   Cutoff 300 ng/mL  The urine drug screen provides only a preliminary, unconfirmed analytical test result and should not be used for non-medical purposes. Clinical consideration and professional judgment should be applied to any positive drug screen result due to possible interfering substances. A more specific alternate chemical method must be used in order to obtain a confirmed analytical result. Gas chromatography / mass spectrometry (GC/MS) is the preferred confirm atory method. Performed at [facility], [address] [place], [place] [address]     Current Facility-Administered Medications  Medication Dose Route Frequency Provider Last Rate Last Admin  . haloperidol (HALDOL) tablet 0.5 mg  0.5 mg Oral Q6H PRN Reggie Pile, MD       Or  . haloperidol lactate (HALDOL) injection 5 mg  5 mg Intramuscular Q6H PRN Reggie Pile, MD       Current Outpatient Medications  Medication Sig Dispense Refill  . acetaminophen (TYLENOL) 500 MG tablet Take 1,000 mg by mouth every 6 (six) hours as needed for mild pain or moderate pain. (Patient not taking: Reported on 10/09/2019)    . cetirizine (ZYRTEC) 10 MG tablet Take 10 mg by mouth daily. (Patient not taking: Reported on 10/09/2019)    . naloxone (NARCAN) nasal spray 4 mg/0.1 mL Use as needed for opiate  overdose (Patient not taking: Reported on 10/09/2019) 1 each 0  . naproxen sodium (ALEVE) 220 MG tablet Take 220 mg by mouth. (Patient not taking: Reported on 10/09/2019)      General Appearance: Well Groomed  Eye Contact:  Good  Speech:  Negative  Volume:  Normal  Mood:  "I'm fine."  Affect:  Congruent  Thought Process:  Coherent  Orientation:  Full (Time, Place, and Person)-   Thought Content:  Negative  Suicidal Thoughts:  No  Homicidal Thoughts:  No  Memory:  Negative  Judgement:  Good  Insight:  fair  Psychomotor  Activity:  Normal  Concentration:  Concentration: poor  Recall:  poor  Fund of Knowledge:  poor  Language:  Good  Akathisia:  Negative  Handed:  Right  AIMS (if indicated):     Assets:  Physical Health  ADL's:  not Intact  Cognition:  decreased  Sleep:        Treatment Plan Summary:   Patient was seen and assessed by the ER emergency room doctor Unable to ascertain any information from the patient We will need to reassess tomorrow His urine drug screen is pending, will encourage him to obtain 1. Patient is noted to be on involuntary commitment Patient is on suicide precautions while emergency department. Add PO/IM Haldol for agitation  collateral information obtained from his mother  8/1 Patient does not meet inpatient criteria Does not want any medications Does not want any chemical dependency treatment, both inpatient or outpatient referrals  Disposition:  Pending. Reggie PileAnand Linville Decarolis, MD 10/10/2019 10:03 AM

## 2019-10-10 NOTE — ED Notes (Signed)
Patient given personal belongings and phone to call ride for discharge

## 2019-10-10 NOTE — ED Provider Notes (Signed)
Emergency Medicine Observation Re-evaluation Note  Marc Marks is a 44 y.o. male, seen on rounds today.  Pt initially presented to the ED for complaints of Medical Clearance Currently, the patient is stable no complaints overnight.  Physical Exam  BP (!) 142/84 (BP Location: Left Arm)   Pulse 70   Temp 98 F (36.7 C) (Oral)   Resp 18   Ht 5\' 10"  (1.778 m)   Wt 90.7 kg   SpO2 96%   BMI 28.70 kg/m  Physical Exam   General: No apparent distress HEENT: moist mucous membranes CV: RRR Pulm: Normal WOB GI: soft and non tender MSK: no edema or cyanosis Neuro: face symmetric, moving all extremities    ED Course / MDM  I have reviewed the labs performed to date as well as medications administered while in observation.  No changes overnight or new labs. Plan  Current plan is for psychiatric placement. Patient is under full IVC at this time.   , Don Perking, MD 10/10/19 [phone]

## 2019-10-10 NOTE — ED Notes (Signed)
Patient given sprite.

## 2019-11-05 ENCOUNTER — Encounter: Payer: Self-pay | Admitting: Emergency Medicine

## 2019-11-05 ENCOUNTER — Other Ambulatory Visit: Payer: Self-pay

## 2019-11-05 ENCOUNTER — Emergency Department: Payer: Self-pay

## 2019-11-05 ENCOUNTER — Emergency Department
Admission: EM | Admit: 2019-11-05 | Discharge: 2019-11-05 | Discharge status: Against Medical Advice | Payer: Self-pay | Attending: Emergency Medicine | Admitting: Emergency Medicine

## 2019-11-05 DIAGNOSIS — E119 Type 2 diabetes mellitus without complications: Secondary | ICD-10-CM | POA: Insufficient documentation

## 2019-11-05 DIAGNOSIS — F17200 Nicotine dependence, unspecified, uncomplicated: Secondary | ICD-10-CM | POA: Insufficient documentation

## 2019-11-05 DIAGNOSIS — F1910 Other psychoactive substance abuse, uncomplicated: Secondary | ICD-10-CM | POA: Insufficient documentation

## 2019-11-05 DIAGNOSIS — I10 Essential (primary) hypertension: Secondary | ICD-10-CM | POA: Insufficient documentation

## 2019-11-05 LAB — CBC
HCT: 42.7 % (ref 39.0–52.0)
Hemoglobin: 13.8 g/dL (ref 13.0–17.0)
MCH: 30.7 pg (ref 26.0–34.0)
MCHC: 32.3 g/dL (ref 30.0–36.0)
MCV: 95.1 fL (ref 80.0–100.0)
Platelets: 356 10*3/uL (ref 150–400)
RBC: 4.49 MIL/uL (ref 4.22–5.81)
RDW: 13.2 % (ref 11.5–15.5)
WBC: 21.8 10*3/uL — ABNORMAL HIGH (ref 4.0–10.5)
nRBC: 0 % (ref 0.0–0.2)

## 2019-11-05 LAB — COMPREHENSIVE METABOLIC PANEL
ALT: 34 U/L (ref 0–44)
AST: 37 U/L (ref 15–41)
Albumin: 4.3 g/dL (ref 3.5–5.0)
Alkaline Phosphatase: 62 U/L (ref 38–126)
Anion gap: 27 — ABNORMAL HIGH (ref 5–15)
BUN: 10 mg/dL (ref 6–20)
CO2: 15 mmol/L — ABNORMAL LOW (ref 22–32)
Calcium: 9.4 mg/dL (ref 8.9–10.3)
Chloride: 99 mmol/L (ref 98–111)
Creatinine, Ser: 1.62 mg/dL — ABNORMAL HIGH (ref 0.61–1.24)
GFR calc Af Amer: 60 mL/min — ABNORMAL LOW (ref >60)
GFR calc non Af Amer: 52 mL/min — ABNORMAL LOW (ref >60)
Glucose, Bld: 328 mg/dL — ABNORMAL HIGH (ref 70–99)
Potassium: 3.3 mmol/L — ABNORMAL LOW (ref 3.5–5.1)
Sodium: 141 mmol/L (ref 135–145)
Total Bilirubin: 1.1 mg/dL (ref 0.3–1.2)
Total Protein: 7.8 g/dL (ref 6.5–8.1)

## 2019-11-05 LAB — ACETAMINOPHEN LEVEL: Acetaminophen (Tylenol), Serum: <10 ug/mL — ABNORMAL LOW (ref 10–30)

## 2019-11-05 LAB — SALICYLATE LEVEL: Salicylate Lvl: <7 mg/dL — ABNORMAL LOW (ref 7.0–30.0)

## 2019-11-05 LAB — ETHANOL: Alcohol, Ethyl (B): <10 mg/dL (ref <10)

## 2019-11-05 MED ORDER — IBUPROFEN 400 MG PO TABS
400.0000 mg | ORAL_TABLET | Freq: Once | ORAL | Status: AC
  Administered 2019-11-05: 400 mg via ORAL
  Filled 2019-11-05: qty 1

## 2019-11-05 MED ORDER — SODIUM CHLORIDE 0.9 % IV SOLN
1000.0000 mL | Freq: Once | INTRAVENOUS | Status: AC
  Administered 2019-11-05: 1000 mL via INTRAVENOUS

## 2019-11-05 MED ORDER — DOXYCYCLINE HYCLATE 100 MG PO TABS: 100.0000 mg | ORAL_TABLET | Freq: Two times a day (BID) | ORAL | 0 refills | Status: AC

## 2019-11-05 NOTE — ED Triage Notes (Signed)
Pt to ED via POV, Pt was pulled out of car unresponsive with snoring respirations. Pt cyanotic upon arrival. Pt has hx/o substance abuse. Pt was given 2 mg IV narcan. Pt started thrashing around and being combative. Pt was restrained for patient and staff safety. EPD at bedside

## 2019-11-05 NOTE — ED Provider Notes (Signed)
Behavioral Restraint Provider Note:  Behavioral Indicators: Danger to others and Violent behavior     Reaction to intervention: accepting     Review of systems: No changes     History: Patient pulled out of car unresponsive, given narcan and became violent and aggressive   Mental Status Exam: Currently AAOX4 and has calmed down  Restraint Continuation: Ernst Spell, MD 11/05/19 1801

## 2019-11-05 NOTE — ED Notes (Addendum)
Pt refused to give urine sample and is requesting to leave at this time., EDP Kinner spoke with pt regarding the potential consequences of leaving AMA> pt still adamantly requesting to leave AMA. Pt and EDP signed AMA form with this RN acting as a witness. Hard copy of AMA form was placed for medical record pick up.   Pt A&Ox4 and breathing independently without supplemental oxygen. Pt left AMA with his mother driving. Both were present when discharge instructions were read and pt was given his belongings (cut up shirt). No pt belongings left in the room. Pt irritable and walking out of room before signing E-signature but verbalized an understanding of all d/c instructions

## 2019-11-05 NOTE — ED Notes (Signed)
Pt mother at bedside

## 2019-11-05 NOTE — ED Notes (Signed)
Pt admits to taking Percocet, pt states that he took 2-3 pills. Pt also states that he did approximately 0.2 grams of cocaine this morning around 0800.

## 2019-11-05 NOTE — ED Provider Notes (Signed)
[facility] Emergency Department Provider Note   ____________________________________________    I have reviewed the triage vital signs and the nursing notes.   HISTORY  Chief Complaint Altered mental status    HPI Marc Marks is a 44 y.o. male with history of diabetes, hypertension, polysubstance abuse presents with altered mental status.  Staff were called to car for unresponsive patient.  Brought in on stretcher, given IV Narcan, patient became aggressive and belligerent which necessitated staff restraining patient for patient and staff safety.  Patient is now more alert and cooperative.  He does admit to using cocaine today, also reports taking a handful of what he thought were Percocet.  He reports he just "passed out afterwards."  Denies chest pain.  No difficulty breathing.  Past Medical History:  Diagnosis Date  . Diabetes mellitus without complication (HCC)   . Hypertension   . Polysubstance abuse [facility])     Patient Active Problem List   Diagnosis Date Noted  . Delirium   . Sepsis with acute respiratory failure (HCC)   . Elevated troponin   . Acute respiratory failure (HCC) 07/31/2019  . Drug overdose   . Polysubstance abuse (HCC)     History reviewed. No pertinent surgical history.  Prior to Admission medications   Medication Sig Start Date End Date Taking? Authorizing Provider  doxycycline (VIBRA-TABS) 100 MG tablet Take 1 tablet (100 mg total) by mouth 2 (two) times daily. 11/05/19   Jene Every, MD     Allergies Tramadol  No family history on file.  Social History Social History   Tobacco Use  . Smoking status: Current Every Day Smoker    Packs/day: 0.25  . Smokeless tobacco: Current User    Types: Snuff  Substance Use Topics  . Alcohol use: Yes  . Drug use: Yes    Types: Marijuana, Cocaine    Comment: percoset    Review of Systems  Constitutional: No fever/chills Eyes: No visual changes.  ENT: No sore  throat. Cardiovascular: Denies chest pain. Respiratory: Denies shortness of breath. Gastrointestinal: No abdominal pain.  Genitourinary: Negative for dysuria. Musculoskeletal: Negative for back pain. Skin: Feels sweaty Neurological: Negative for headaches    ____________________________________________   PHYSICAL EXAM:  VITAL SIGNS: ED Triage Vitals  Enc Vitals Group     BP --      Pulse Rate 11/05/19 1749 (!) 146     Resp 11/05/19 1749 20     Temp --      Temp src --      SpO2 11/05/19 1748 95 %     Weight 11/05/19 1749 90.7 kg (200 lb)     Height 11/05/19 1749 1.778 m (5\' 10" )     Head Circumference --      Peak Flow --      Pain Score 11/05/19 1749 0     Pain Loc --      Pain Edu? --      Excl. in GC? --     Constitutional: Initially unresponsive, after Narcan now responsive and answering questions appropriately Eyes: Conjunctivae are normal.  Head: Atraumatic. Nose: No congestion/rhinnorhea. Mouth/Throat: Mucous membranes are moist.    Cardiovascular: Tachycardia, regular rhythm. Grossly normal heart sounds.  Good peripheral circulation. Respiratory: Normal respiratory effort.  No retractions. Lungs CTAB. Gastrointestinal: Soft and nontender. No distention.  No CVA tenderness. Genitourinary: deferred Musculoskeletal: No lower extremity tenderness nor edema.  Warm and well perfused Neurologic:  Normal speech and language. No gross  focal neurologic deficits are appreciated.  Skin:  Skin is warm, diaphoretic Psychiatric: Mood and affect are normal. Speech and behavior are normal.  ____________________________________________   LABS (all labs ordered are listed, but only abnormal results are displayed)  Labs Reviewed  CBC - Abnormal; Notable for the following components:      Result Value   WBC 21.8 (*)    All other components within normal limits  COMPREHENSIVE METABOLIC PANEL - Abnormal; Notable for the following components:   Potassium 3.3 (*)    CO2 15  (*)    Glucose, Bld 328 (*)    Creatinine, Ser 1.62 (*)    GFR calc non Af Amer 52 (*)    GFR calc Af Amer 60 (*)    Anion gap 27 (*)    All other components within normal limits  SALICYLATE LEVEL - Abnormal; Notable for the following components:   Salicylate Lvl <7.0 (*)    All other components within normal limits  ACETAMINOPHEN LEVEL - Abnormal; Notable for the following components:   Acetaminophen (Tylenol), Serum <10 (*)    All other components within normal limits  ETHANOL  URINE DRUG SCREEN, QUALITATIVE ([facility] ONLY)   ____________________________________________  EKG  ED ECG REPORT I, Jene Every, the attending physician, personally viewed and interpreted this ECG.  Date: 11/05/2019  Rhythm: Sinus tachycardia QRS Axis: normal Intervals: normal ST/T Wave abnormalities: normal Narrative Interpretation: no evidence of acute ischemia  ____________________________________________  RADIOLOGY  Chest x-ray reviewed by me, no evidence of aspiration or infiltrate ____________________________________________   PROCEDURES  Procedure(s) performed: No  .1-3 Lead EKG Interpretation Performed by: Jene Every, MD Authorized by: Jene Every, MD     Interpretation: abnormal     ECG rate:  150   ECG rate assessment: tachycardic     Rhythm: sinus tachycardia     Ectopy: none     Conduction: normal       Critical Care performed: yes  CRITICAL CARE Performed by: Jene Every   Total critical care time: 35 minutes  Critical care time was exclusive of separately billable procedures and treating other patients.  Critical care was necessary to treat or prevent imminent or life-threatening deterioration.  Critical care was time spent personally by me on the following activities: development of treatment plan with patient and/or surrogate as well as nursing, discussions with consultants, evaluation of patient's response to treatment, examination of patient,  obtaining history from patient or surrogate, ordering and performing treatments and interventions, ordering and review of laboratory studies, ordering and review of radiographic studies, pulse oximetry and re-evaluation of patient's condition.  ____________________________________________   INITIAL IMPRESSION / ASSESSMENT AND PLAN / ED COURSE  Pertinent labs & imaging results that were available during my care of the patient were reviewed by me and considered in my medical decision making (see chart for details).  Patient presented unresponsive, physically pulled from vehicle mildly cyanotic initially. Responded immediately to Narcan, became belligerent and aggressive as described above. Was physically restrained with soft restraints bilateral wrist and ankles. Patient was restrained at 545, restraints removed approximately 15 minutes later after he had notably calm down and was no longer aggressive or threatening to staff.  He admits to taking a "handful "of Percocet, also admits to using cocaine this morning.  Review of records demonstrates a history of polysubstance abuse which has required admission in the past.  We will give IV fluids, ending labs, UDS, ethanol level, Tylenol level, chest x-ray, patient on the cardiac  monitor.  ----------------------------------------- 7:47 PM on 11/05/2019 -----------------------------------------  Patient is feeling much better after fluids, heart rate is improved significantly.  He is anxious to leave.  Discussed with him abnormal white blood cell count and abnormal anion gap and my desire to repeat labs however he does not want to wait because he feels well.  Patient is leaving AGAINST MEDICAL ADVICE, he has decisional capacity and is leaving with his mother              ____________________________________________   FINAL CLINICAL IMPRESSION(S) / ED DIAGNOSES  Final diagnoses:  Polysubstance abuse (HCC)        Note:  This  document was prepared using Dragon voice recognition software and may include unintentional dictation errors.   Jene Every, MD 11/05/19 [phone]

## 2020-05-19 ENCOUNTER — Emergency Department
Admission: EM | Admit: 2020-05-19 | Discharge: 2020-05-20 | Disposition: A | Discharge status: Home / Self Care | Payer: Self-pay | Attending: Emergency Medicine | Admitting: Emergency Medicine

## 2020-05-19 ENCOUNTER — Other Ambulatory Visit: Payer: Self-pay

## 2020-05-19 DIAGNOSIS — F17200 Nicotine dependence, unspecified, uncomplicated: Secondary | ICD-10-CM | POA: Insufficient documentation

## 2020-05-19 DIAGNOSIS — I10 Essential (primary) hypertension: Secondary | ICD-10-CM | POA: Insufficient documentation

## 2020-05-19 DIAGNOSIS — R451 Restlessness and agitation: Secondary | ICD-10-CM | POA: Insufficient documentation

## 2020-05-19 DIAGNOSIS — E119 Type 2 diabetes mellitus without complications: Secondary | ICD-10-CM | POA: Insufficient documentation

## 2020-05-19 DIAGNOSIS — F1910 Other psychoactive substance abuse, uncomplicated: Secondary | ICD-10-CM | POA: Insufficient documentation

## 2020-05-19 DIAGNOSIS — R410 Disorientation, unspecified: Secondary | ICD-10-CM | POA: Insufficient documentation

## 2020-05-19 LAB — CBC
HCT: 37.3 % — ABNORMAL LOW (ref 39.0–52.0)
Hemoglobin: 12.4 g/dL — ABNORMAL LOW (ref 13.0–17.0)
MCH: 30.4 pg (ref 26.0–34.0)
MCHC: 33.2 g/dL (ref 30.0–36.0)
MCV: 91.4 fL (ref 80.0–100.0)
Platelets: 308 10*3/uL (ref 150–400)
RBC: 4.08 MIL/uL — ABNORMAL LOW (ref 4.22–5.81)
RDW: 13.6 % (ref 11.5–15.5)
WBC: 11.2 10*3/uL — ABNORMAL HIGH (ref 4.0–10.5)
nRBC: 0 % (ref 0.0–0.2)

## 2020-05-19 LAB — COMPREHENSIVE METABOLIC PANEL
ALT: 10 U/L (ref 0–44)
AST: 21 U/L (ref 15–41)
Albumin: 3.6 g/dL (ref 3.5–5.0)
Alkaline Phosphatase: 61 U/L (ref 38–126)
Anion gap: 12 (ref 5–15)
BUN: 14 mg/dL (ref 6–20)
CO2: 20 mmol/L — ABNORMAL LOW (ref 22–32)
Calcium: 8.5 mg/dL — ABNORMAL LOW (ref 8.9–10.3)
Chloride: 111 mmol/L (ref 98–111)
Creatinine, Ser: 1.28 mg/dL — ABNORMAL HIGH (ref 0.61–1.24)
GFR, Estimated: >60 mL/min (ref >60)
Glucose, Bld: 282 mg/dL — ABNORMAL HIGH (ref 70–99)
Potassium: 3.8 mmol/L (ref 3.5–5.1)
Sodium: 143 mmol/L (ref 135–145)
Total Bilirubin: 0.9 mg/dL (ref 0.3–1.2)
Total Protein: 6.8 g/dL (ref 6.5–8.1)

## 2020-05-19 MED ORDER — SODIUM CHLORIDE 0.9 % IV SOLN
1000.0000 mL | Freq: Once | INTRAVENOUS | Status: AC
  Administered 2020-05-19: 1000 mL via INTRAVENOUS

## 2020-05-19 MED ORDER — LORAZEPAM 2 MG/ML IJ SOLN
INTRAMUSCULAR | Status: AC
  Filled 2020-05-19: qty 1

## 2020-05-19 MED ORDER — SODIUM CHLORIDE 0.9 % IV SOLN: 1000.0000 mL | Freq: Once | INTRAVENOUS | Status: DC

## 2020-05-19 MED ORDER — MIDAZOLAM HCL 2 MG/2ML IJ SOLN
1.0000 mg | Freq: Once | INTRAMUSCULAR | Status: AC
  Administered 2020-05-19: 1 mg via INTRAVENOUS
  Filled 2020-05-19: qty 2

## 2020-05-19 MED ORDER — MIDAZOLAM HCL 2 MG/2ML IJ SOLN: 2.0000 mg | Freq: Once | INTRAMUSCULAR | Status: DC

## 2020-05-19 MED ORDER — NALOXONE HCL 2 MG/2ML IJ SOSY: 0.4000 mg | PREFILLED_SYRINGE | Freq: Once | INTRAMUSCULAR | Status: DC

## 2020-05-19 MED ORDER — HALOPERIDOL LACTATE 5 MG/ML IJ SOLN
10.0000 mg | Freq: Once | INTRAMUSCULAR | Status: AC
  Administered 2020-05-19: 10 mg via INTRAVENOUS
  Filled 2020-05-19: qty 2

## 2020-05-19 MED ORDER — LORAZEPAM 2 MG/ML IJ SOLN
2.0000 mg | Freq: Once | INTRAMUSCULAR | Status: AC
  Administered 2020-05-19: 2 mg via INTRAVENOUS

## 2020-05-19 NOTE — ED Triage Notes (Signed)
Pt arrived via [facility] with c/o excited delirium per EMS.   Per EMS, pt "smoked something" and became extremely animated, hyperthermic, and tachycardic.   Pt given 2.5mg  versed IV.   Per EMS, pt has hx of meth use, unknown when last use date was  Per EMS, CBG 261, BP 89/44, RR dropped to 8 after admin of versed.   Pt arrived with EMS assisting respirations with ambu-bag.

## 2020-05-19 NOTE — ED Provider Notes (Addendum)
Patient has been up, reports he wants to call his mother to come pick him up for a ride.  He is up and back and forth to use the bathroom.  He is calm and more compliant now.  Patient seems to be improving.  And currently resting, still a little somnolent secondary I would assume to sedating medications and possibly washout.  Suspected stimulant abuse.  History of polypharmacy and drug abuse that I suspect is led to his presentation today.  Continue to observe for improvement, plan to reassess when patient is fully alert ambulatory.  Remains on monitoring   Sharyn Creamer, MD 05/19/20 2000   ----------------------------------------- 11:01 PM on 05/19/2020 -----------------------------------------  Patient remains resting now, occasionally will get up and make phone calls seems a bit agitated between.  He is back to resting at this time.  He seems to be upset that does not have anyone to come pick him up, but also seems to be still somewhat under the presumed influence.   We will continue to observe the patient, ongoing care signed Dr. Don Perking  Vitals:   05/19/20 2200 05/19/20 2235  BP: (!) 132/118 (!) 130/100  Pulse: 65 65  Resp: 10 10  Temp:    SpO2: 94% 94%       Sharyn Creamer, MD 05/19/20 2302

## 2020-05-19 NOTE — ED Provider Notes (Addendum)
[facility] Emergency Department Provider Note   ____________________________________________    I have reviewed the triage vital signs and the nursing notes.   HISTORY  Chief Complaint Drug Overdose  History limited by altered mental status   HPI Marc Marks is a 45 y.o. male with history of diabetes, hypertension, polysubstance abuse who presents with altered mental status.  EMS reports the patient reported to them that he "smoked something that tasted good "and then became agitated and delirious, they did give 2 mg of IV Versed and the patient then probably became somnolent and required bag-valve-mask.  Review of history demonstrates that I have seen this patient in the past for substance abuse Past Medical History:  Diagnosis Date  . Diabetes mellitus without complication (HCC)   . Hypertension   . Polysubstance abuse [facility])     Patient Active Problem List   Diagnosis Date Noted  . Delirium   . Sepsis with acute respiratory failure (HCC)   . Elevated troponin   . Acute respiratory failure (HCC) 07/31/2019  . Drug overdose   . Polysubstance abuse (HCC)     No past surgical history on file.  Prior to Admission medications   Medication Sig Start Date End Date Taking? Authorizing Provider  doxycycline (VIBRA-TABS) 100 MG tablet Take 1 tablet (100 mg total) by mouth 2 (two) times daily. 11/05/19   Jene Every, MD     Allergies Tramadol  No family history on file.  Social History Social History   Tobacco Use  . Smoking status: Current Every Day Smoker    Packs/day: 0.25  . Smokeless tobacco: Current User    Types: Snuff  Substance Use Topics  . Alcohol use: Yes  . Drug use: Yes    Types: Marijuana, Cocaine    Comment: percoset    Unable to obtain review of Systems     ____________________________________________   PHYSICAL EXAM:  VITAL SIGNS: ED Triage Vitals  Enc Vitals Group     BP 05/19/20 1408 (!) 124/56      Pulse Rate 05/19/20 1408 (!) 112     Resp 05/19/20 1408 (!) 9     Temp --      Temp src --      SpO2 05/19/20 1408 94 %     Weight 05/19/20 1409 86.2 kg (190 lb)     Height 05/19/20 1409 1.778 m (5\' 10" )     Head Circumference --      Peak Flow --      Pain Score 05/19/20 1409 0     Pain Loc --      Pain Edu? --      Excl. in GC? --     Constitutional: Drowsy, snoring Eyes: Conjunctivae are normal.   Nose: No congestion/rhinnorhea. Mouth/Throat: Mucous membranes are moist.    Cardiovascular: Normal rate, regular rhythm. Grossly normal heart sounds.  Good peripheral circulation. Respiratory: Normal respiratory effort.  No retractions. Lungs CTAB. Gastrointestinal: Soft and nontender. No distention.    Musculoskeletal: Warm and well perfused Neurologic:  Normal speech and language. No gross focal neurologic deficits are appreciated.  Skin:  Skin is warm, dry and intact. No rash noted. Psychiatric: Unable to examine  ____________________________________________   LABS (all labs ordered are listed, but only abnormal results are displayed)  Labs Reviewed  CBC - Abnormal; Notable for the following components:      Result Value   WBC 11.2 (*)    RBC 4.08 (*)  Hemoglobin 12.4 (*)    HCT 37.3 (*)    All other components within normal limits  COMPREHENSIVE METABOLIC PANEL   ____________________________________________  EKG  ED ECG REPORT I, Jene Every, the attending physician, personally viewed and interpreted this ECG.  Date: 05/19/2020  Rhythm: Sinus tachycardia QRS Axis: normal Intervals: normal ST/T Wave abnormalities: normal Narrative Interpretation: no evidence of acute ischemia  ____________________________________________  RADIOLOGY  None ____________________________________________   PROCEDURES  Procedure(s) performed: No  Procedures   Critical Care performed: no   ____________________________________________   INITIAL IMPRESSION /  ASSESSMENT AND PLAN / ED COURSE  Pertinent labs & imaging results that were available during my care of the patient were reviewed by me and considered in my medical decision making (see chart for details).  Patient presents with altered mental status.  Almost certainly related to polysubstance abuse given history.  Was given Versed by EMS, may have delayed onset of opioid sedation, will give small dose of Narcan  Hypopnea, noted, on nasal cannula oxygen oxygen saturations mid 90s, mildly tachycardic, blood pressure stable.  Intermittently awakens with agitation.  Patient has a history of requiring physical restraints for staff protection, will closely monitor.  Patient far more alert now, will hold Narcan symptoms are consistent with polysubstance abuse, he does admit to this  We will continue to observe to sobriety in the emergency department.  ----------------------------------------- 3:10 PM on 05/19/2020 -----------------------------------------  Patient with abnormal very well behavior screaming at the top of his lungs consistent with substance abuse.  1 mg of IV Versed given with little improvement, will give an additional 2 mg of IV Ativan, IV fluids will carefully monitor    ____________________________________________   FINAL CLINICAL IMPRESSION(S) / ED DIAGNOSES  Final diagnoses:  Polysubstance abuse (HCC)        Note:  This document was prepared using Dragon voice recognition software and may include unintentional dictation errors.   Jene Every, MD 05/19/20 1437    Jene Every, MD 05/19/20 1511    Jene Every, MD 05/19/20 [phone]

## 2020-05-19 NOTE — ED Notes (Signed)
Pt given cup of water at this time, verified with MD Cyril Loosen

## 2020-05-19 NOTE — ED Notes (Signed)
Pt laying in bed, NAD noted. Pt is on pulse ox. Pt noted to be resting.

## 2020-05-19 NOTE — ED Notes (Signed)
Pt currently screaming "Thank you so much sir", "Woohoo", and "Sorry" repeatedly and making grunting noises at this time. Pt able to be heard from nurses station at this time.

## 2020-05-19 NOTE — ED Notes (Signed)
Pt awake and up to toilet for BM at this time.

## 2020-05-19 NOTE — ED Notes (Signed)
Pt ripped off BP cuff at this time and is still screaming/shaking stretcher at this time, will re-attempt to take VS at a later time

## 2020-05-19 NOTE — ED Notes (Addendum)
Pt pulled out both PIV at this time. Megan RN to bedside to place new PIV. Pt unable to sit still, and extremely diaphoretic to the point that cardiac monitor will not stay on. Pt repeatedly reminded not to hit himself at this time. 10mg  haldol given per verbal order from MD   Will restart ordered IVF when pt is more calm

## 2020-05-20 NOTE — ED Notes (Addendum)
Pt is up walking around the room stating "I want to go." Pt has removed all monitoring devices and is on phone with his mother.  Pt states he came in for seizures.

## 2020-05-20 NOTE — ED Provider Notes (Signed)
Patient ambulating with steady gait, clinically sober, tolerating p.o.  His mother is here to take him home.  He will be discharged to her care.  Standard return precautions discussed   Nita Sickle, MD 05/20/20 [phone]

## 2020-05-20 NOTE — ED Notes (Addendum)
RN called pts mother and mother states she will come and pick up patient. Provider stated she will discharge pt when he has a safe ride home.

## 2020-08-07 IMAGING — DX DG CHEST 1V PORT
1 series · 1 of 1 positions shown · non-contrast
Comparison: Prior radiograph from 07/28/2016.

CLINICAL DATA: Initial evaluation for intubation.

EXAM:
PORTABLE CHEST 1 VIEW

[chest]
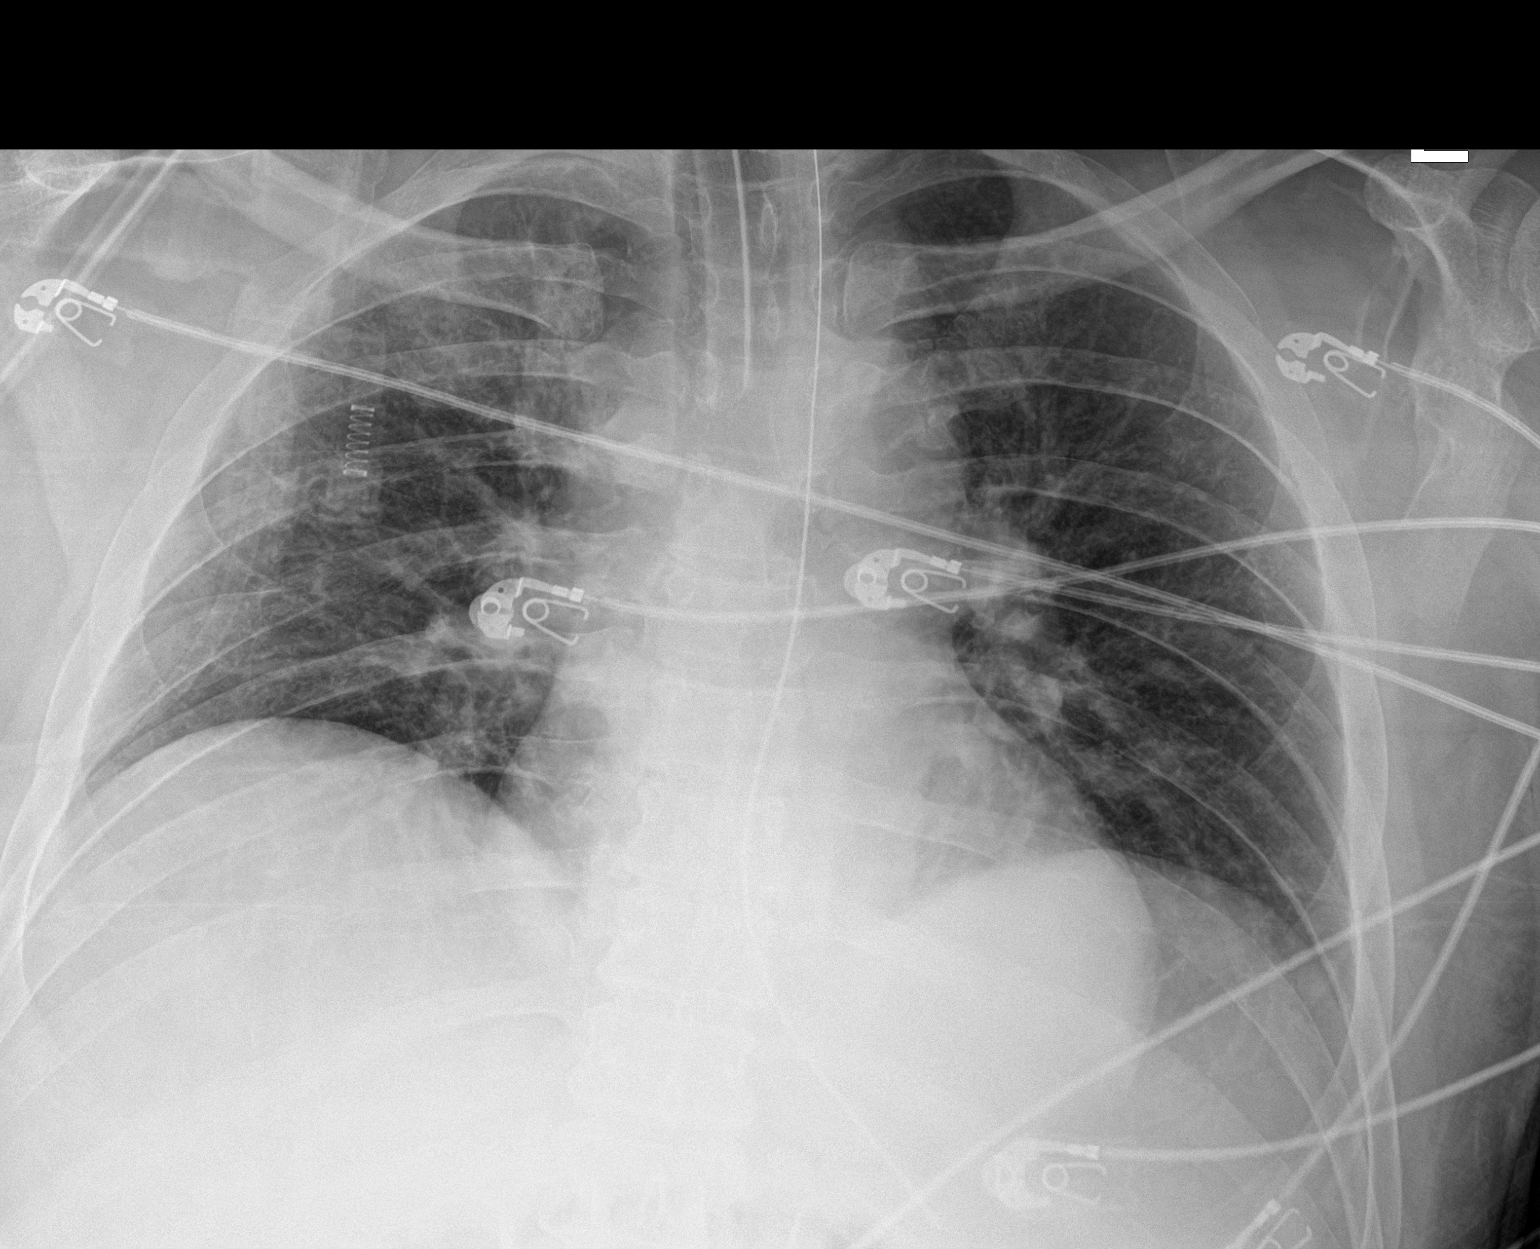

[1 of 1 positions shown; findings below may reference images not displayed]

FINDINGS: Endotracheal tube in place with tip positioned 2.4 cm above the
carina. Enteric tube courses into the abdomen, nonvisualization of
the tip.

Transverse heart size within normal limits. Mediastinal silhouette
normal.

Lungs are hypoinflated. Secondary diffuse bronchovascular crowding.
No definite focal infiltrates. No edema or effusion. No
pneumothorax.

Visualized osseous structures within normal limits.
IMPRESSION: 1. Tip of endotracheal tube 2.4 cm above the carina.
2. Shallow lung inflation with secondary diffuse bronchovascular
crowding. No other active cardiopulmonary disease identified.

## 2020-08-07 IMAGING — CT CT HEAD W/O CM
4 series · 16 of 47 positions shown, 18 images · non-contrast
Comparison: None.

CLINICAL DATA: Initial evaluation for acute unresponsiveness.

EXAM:
CT HEAD WITHOUT CONTRAST
TECHNIQUE: Contiguous axial images were obtained from the base of the skull
through the vertex without intravenous contrast.

[Series 3: head without · axial · non-contrast · 0.45mm/px · z∈[-144,-14]mm · 7 of 36 slices shown, 9 images]
[im 5/36  brain]
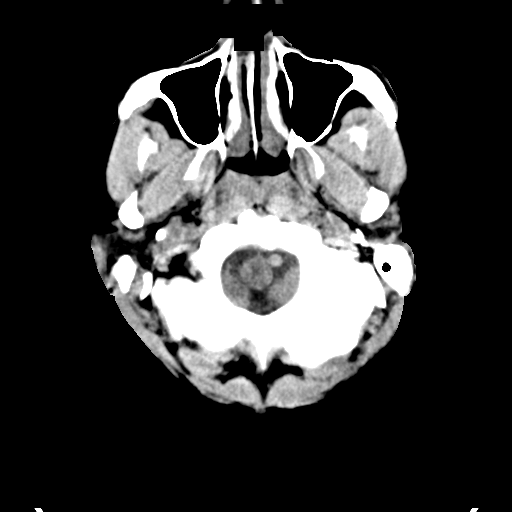
[im 5/36  bone]
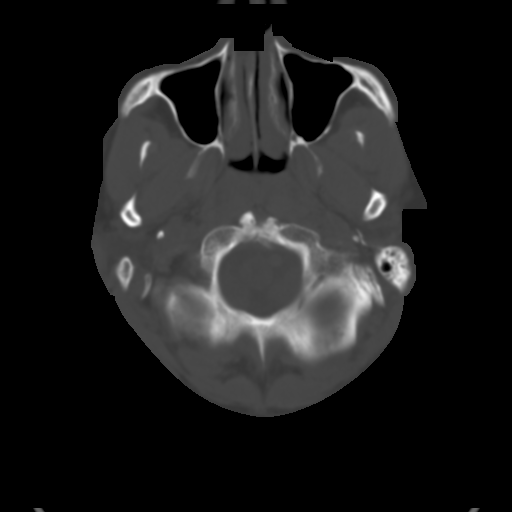
[im 9/36  brain]
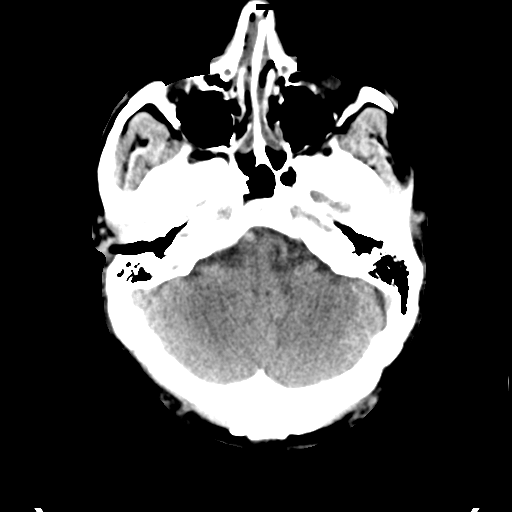
[im 14/36  brain]
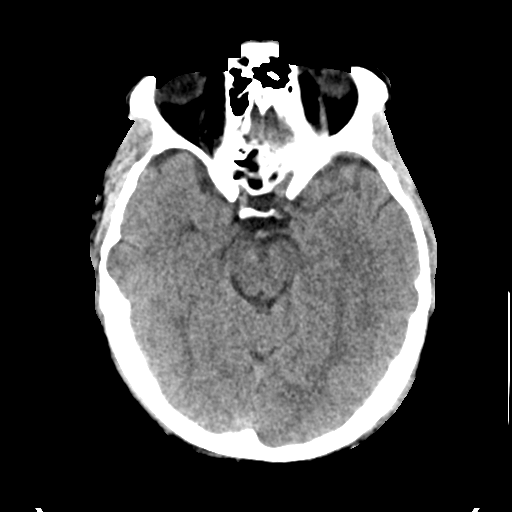
[im 18/36  brain]
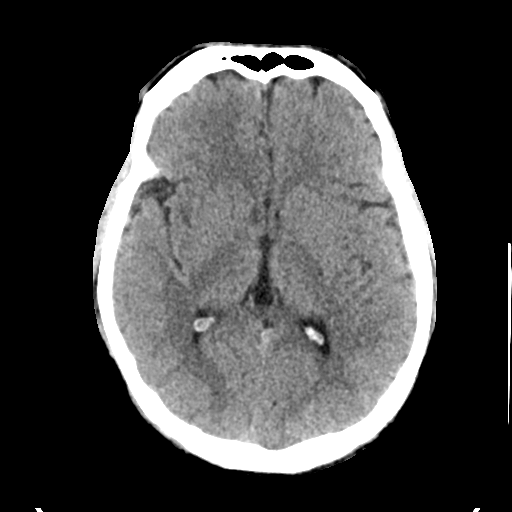
[im 22/36  brain]
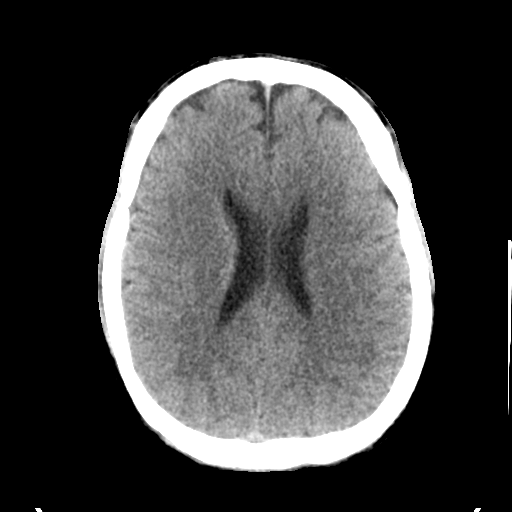
[im 22/36  bone]
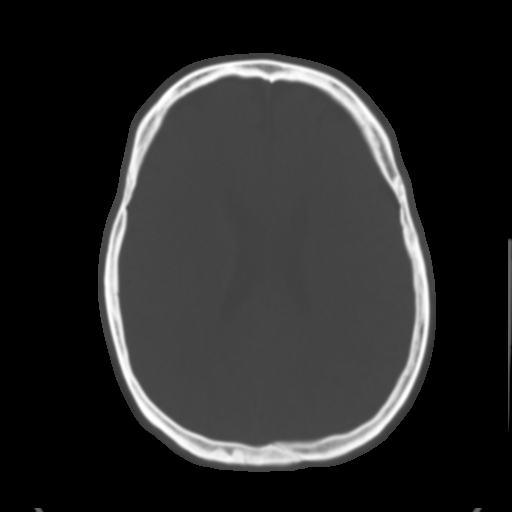
[im 27/36  brain]
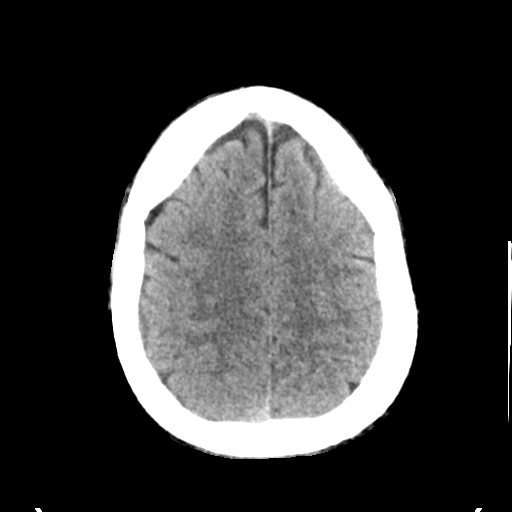
[im 31/36  brain]
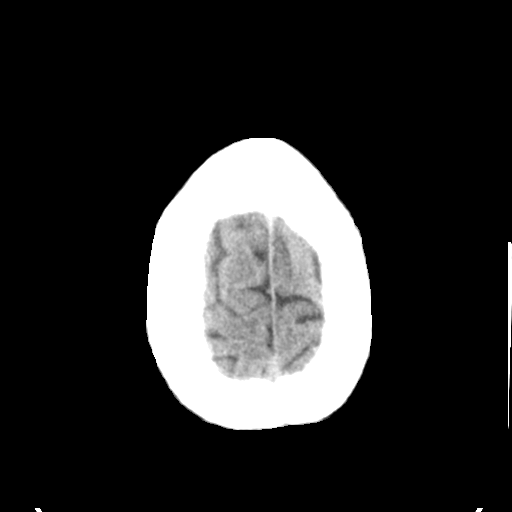

[Series 4: head bone · axial · 0.45mm/px · z∈[-148,-112]mm · 3 of 90 slices shown]
[im 9/90  bone]
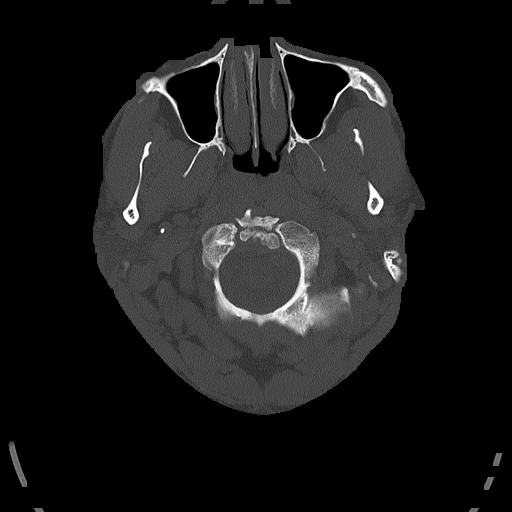
[im 18/90  bone]
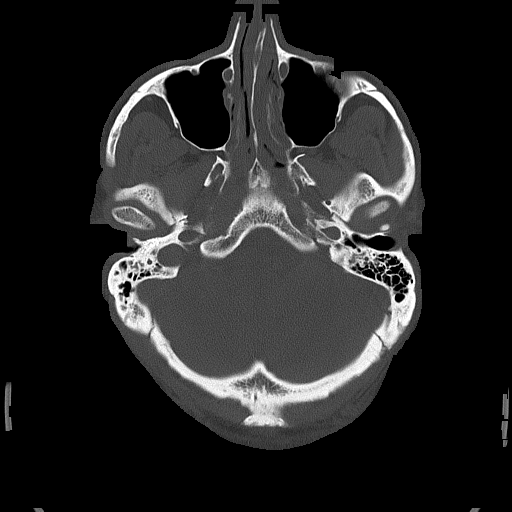
[im 27/90  bone]
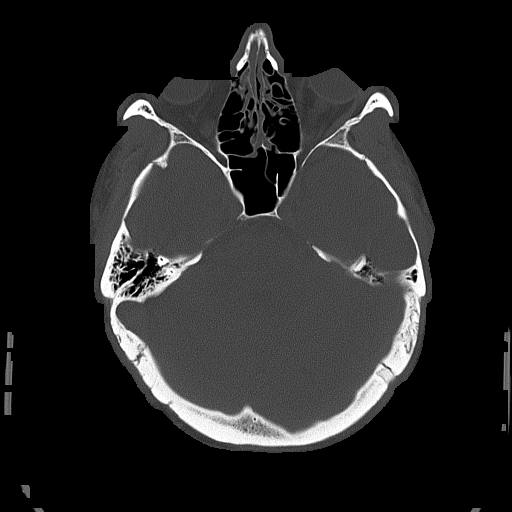

[Series 5: head without cor · coronal · non-contrast · 0.36mm/px · 3 of 72 slices shown]
[im 24/72  brain]
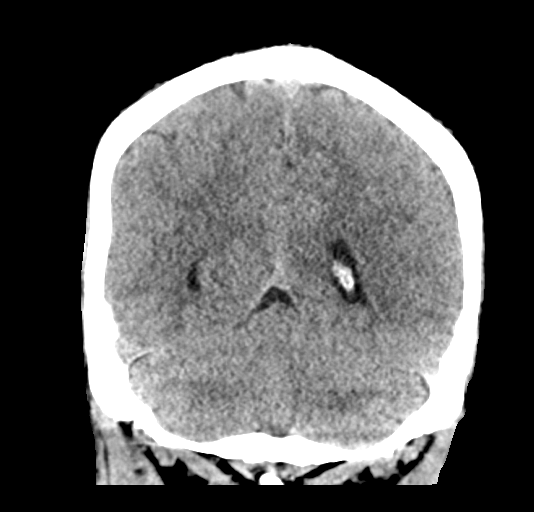
[im 32/72  brain]
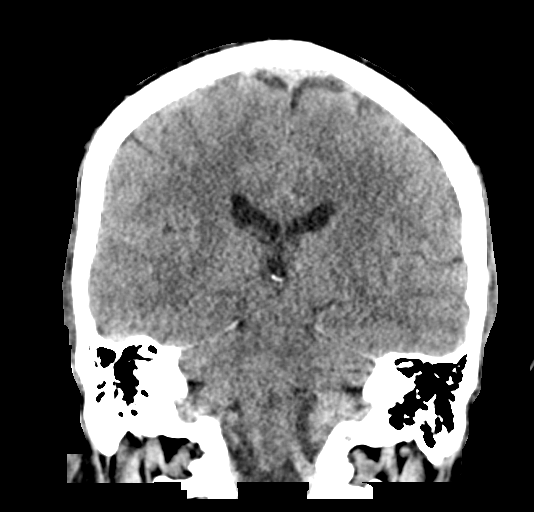
[im 40/72  brain]
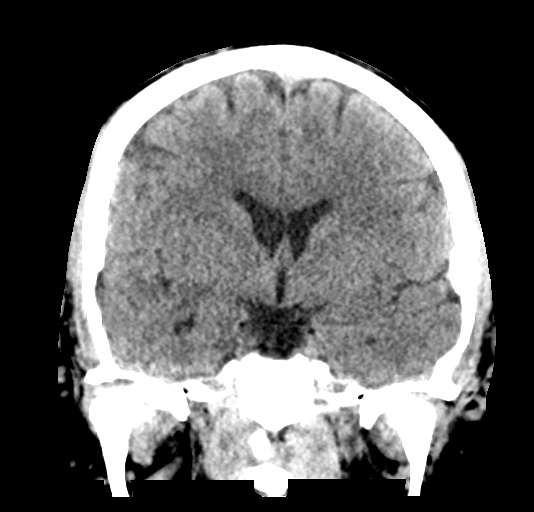

[Series 6: head without sag · sagittal · non-contrast · 0.35mm/px · 3 of 60 slices shown]
[im 20/60  brain]
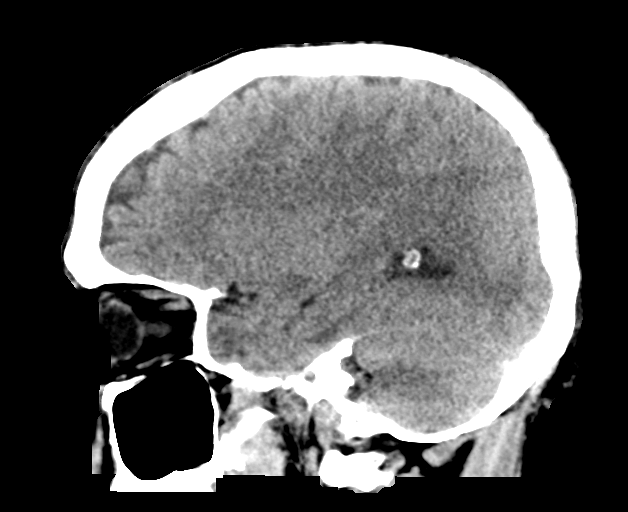
[im 30/60  brain]
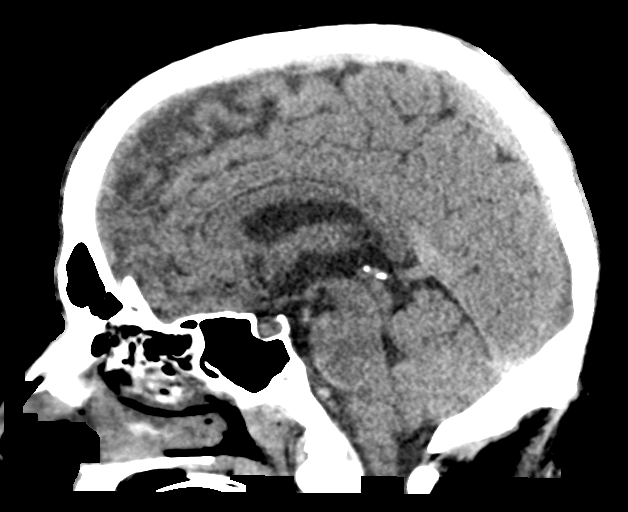
[im 40/60  brain]
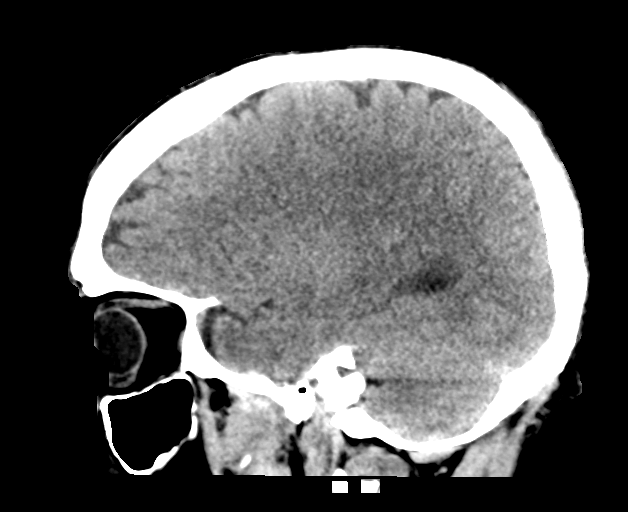

[16 of 47 positions shown; findings below may reference images not displayed]

FINDINGS: Brain: Cerebral volume within normal limits for patient age.

No evidence for acute intracranial hemorrhage. No findings to
suggest acute large vessel territory infarct. No mass lesion,
midline shift, or mass effect. Ventricles are normal in size without
evidence for hydrocephalus. No extra-axial fluid collection
identified.

Vascular: No hyperdense vessel identified.

Skull: Scalp soft tissues demonstrate no acute abnormality.
Calvarium intact.

Sinuses/Orbits: Globes and orbital soft tissues within normal
limits.

Visualized paranasal sinuses are clear. No mastoid effusion. Mild
scattered mucosal thickening noted within the ethmoidal air cells.
Enteric tube partially visualized. Trace right mastoid effusion.
IMPRESSION: Normal head CT.  No acute intracranial abnormality identified.
